# Patient Record
Sex: Male | Born: 1945 | Race: White | Hispanic: No | Marital: Married | Smoking: Never smoker
Health system: Southern US, Community
[De-identification: ages and names within clinical notes are randomized; demographics above are authoritative.]

---

## 1998-12-09 ENCOUNTER — Encounter: Admission: RE | Admit: 1998-12-09 | Discharge: 1998-12-09 | Payer: Self-pay | Admitting: Sports Medicine

## 1999-01-26 ENCOUNTER — Encounter: Admission: RE | Admit: 1999-01-26 | Discharge: 1999-01-26 | Payer: Self-pay | Admitting: Family Medicine

## 1999-02-27 ENCOUNTER — Encounter: Admission: RE | Admit: 1999-02-27 | Discharge: 1999-02-27 | Payer: Self-pay | Admitting: Family Medicine

## 1999-03-02 ENCOUNTER — Encounter: Admission: RE | Admit: 1999-03-02 | Discharge: 1999-03-02 | Payer: Self-pay | Admitting: Family Medicine

## 1999-04-19 ENCOUNTER — Encounter: Admission: RE | Admit: 1999-04-19 | Discharge: 1999-04-19 | Payer: Self-pay | Admitting: Family Medicine

## 1999-05-01 ENCOUNTER — Encounter: Admission: RE | Admit: 1999-05-01 | Discharge: 1999-05-01 | Payer: Self-pay | Admitting: Family Medicine

## 1999-06-27 ENCOUNTER — Encounter: Admission: RE | Admit: 1999-06-27 | Discharge: 1999-06-27 | Payer: Self-pay | Admitting: Sports Medicine

## 2000-09-09 ENCOUNTER — Encounter: Admission: RE | Admit: 2000-09-09 | Discharge: 2000-09-09 | Payer: Self-pay | Admitting: Family Medicine

## 2006-10-29 LAB — CONVERTED CEMR LAB: PSA: NORMAL ng/mL

## 2006-11-18 ENCOUNTER — Ambulatory Visit: Payer: Self-pay | Admitting: Family Medicine

## 2006-11-18 ENCOUNTER — Encounter: Payer: Self-pay | Admitting: Family Medicine

## 2006-11-18 DIAGNOSIS — K4090 Unilateral inguinal hernia, without obstruction or gangrene, not specified as recurrent: Secondary | ICD-10-CM | POA: Insufficient documentation

## 2006-11-18 DIAGNOSIS — M6281 Muscle weakness (generalized): Secondary | ICD-10-CM | POA: Insufficient documentation

## 2006-11-20 ENCOUNTER — Encounter: Payer: Self-pay | Admitting: Family Medicine

## 2006-11-20 ENCOUNTER — Ambulatory Visit: Payer: Self-pay | Admitting: Family Medicine

## 2006-11-20 LAB — CONVERTED CEMR LAB
ALT: 38 units/L (ref 0–53)
AST: 26 units/L (ref 0–37)
Albumin: 4.7 g/dL (ref 3.5–5.2)
Alkaline Phosphatase: 70 units/L (ref 39–117)
BUN: 18 mg/dL (ref 6–23)
HDL: 75 mg/dL (ref >39)
LDL Cholesterol: 177 mg/dL — ABNORMAL HIGH (ref 0–99)
Potassium: 5.4 meq/L — ABNORMAL HIGH (ref 3.5–5.3)
Sodium: 139 meq/L (ref 135–145)
Total Protein: 7.4 g/dL (ref 6.0–8.3)

## 2006-11-21 ENCOUNTER — Telehealth: Payer: Self-pay | Admitting: Family Medicine

## 2006-11-21 DIAGNOSIS — E7800 Pure hypercholesterolemia, unspecified: Secondary | ICD-10-CM | POA: Insufficient documentation

## 2006-12-31 ENCOUNTER — Encounter: Payer: Self-pay | Admitting: Family Medicine

## 2007-04-25 ENCOUNTER — Ambulatory Visit: Payer: Self-pay | Admitting: Family Medicine

## 2007-04-25 LAB — CONVERTED CEMR LAB
ALT: 31 units/L (ref 0–53)
CO2: 21 meq/L (ref 19–32)
Direct LDL: 93 mg/dL
Sodium: 139 meq/L (ref 135–145)
Total Bilirubin: 0.5 mg/dL (ref 0.3–1.2)
Total Protein: 7.3 g/dL (ref 6.0–8.3)

## 2009-01-26 ENCOUNTER — Ambulatory Visit: Payer: Self-pay | Admitting: Family Medicine

## 2009-01-28 ENCOUNTER — Telehealth: Payer: Self-pay | Admitting: Family Medicine

## 2009-01-28 LAB — CONVERTED CEMR LAB
Albumin: 4.6 g/dL (ref 3.5–5.2)
Alkaline Phosphatase: 45 units/L (ref 39–117)
HDL: 59 mg/dL (ref >39)
LDL Cholesterol: 73 mg/dL (ref 0–99)
PSA: 0.8 ng/mL (ref 0.10–4.00)
Total Bilirubin: 0.5 mg/dL (ref 0.3–1.2)
Total CHOL/HDL Ratio: 2.4

## 2010-03-23 ENCOUNTER — Other Ambulatory Visit: Payer: Self-pay | Admitting: Family Medicine

## 2010-03-23 NOTE — Telephone Encounter (Signed)
Will route to Dr. Leveda Anna

## 2011-06-04 ENCOUNTER — Other Ambulatory Visit: Payer: Self-pay | Admitting: Family Medicine

## 2012-05-17 ENCOUNTER — Other Ambulatory Visit: Payer: Self-pay | Admitting: Family Medicine

## 2012-05-19 ENCOUNTER — Telehealth: Payer: Self-pay | Admitting: Family Medicine

## 2012-05-19 NOTE — Telephone Encounter (Signed)
Pt will be out of Simvastatin before his appt on the 24th.  Needs to know if he can have enough called in until then  [facility] [place]

## 2012-05-20 NOTE — Telephone Encounter (Signed)
[facility] will give [facility] enough until dr's appt.pt informed. Isaac Crane, Isaac Crane

## 2012-05-20 NOTE — Telephone Encounter (Signed)
Please call in just enough medicine to last until his appointment. 

## 2012-06-06 ENCOUNTER — Ambulatory Visit (INDEPENDENT_AMBULATORY_CARE_PROVIDER_SITE_OTHER): Payer: BLUE CROSS/BLUE SHIELD | Admitting: Family Medicine

## 2012-06-06 ENCOUNTER — Encounter: Payer: Self-pay | Admitting: Family Medicine

## 2012-06-06 VITALS — BP 128/88 | Temp 97.4°F | Wt 208.1 lb

## 2012-06-06 DIAGNOSIS — E7800 Pure hypercholesterolemia, unspecified: Secondary | ICD-10-CM

## 2012-06-06 DIAGNOSIS — Z23 Encounter for immunization: Secondary | ICD-10-CM

## 2012-06-06 DIAGNOSIS — Z125 Encounter for screening for malignant neoplasm of prostate: Secondary | ICD-10-CM

## 2012-06-06 DIAGNOSIS — Z0000 Encounter for general adult medical examination without abnormal findings: Secondary | ICD-10-CM

## 2012-06-06 DIAGNOSIS — G4700 Insomnia, unspecified: Secondary | ICD-10-CM | POA: Insufficient documentation

## 2012-06-06 LAB — COMPLETE METABOLIC PANEL WITH GFR
CO2: 27 mEq/L (ref 19–32)
Creat: 1.09 mg/dL (ref 0.50–1.35)
GFR, Est African American: 81 mL/min
GFR, Est Non African American: 70 mL/min
Glucose, Bld: 94 mg/dL (ref 70–99)
Total Bilirubin: 0.5 mg/dL (ref 0.3–1.2)
Total Protein: 7.6 g/dL (ref 6.0–8.3)

## 2012-06-06 LAB — LIPID PANEL
Cholesterol: 229 mg/dL — ABNORMAL HIGH (ref 0–200)
HDL: 79 mg/dL (ref >39)
Triglycerides: 92 mg/dL (ref <150)

## 2012-06-06 LAB — PSA: PSA: 0.87 ng/mL (ref <=4.00)

## 2012-06-06 MED ORDER — SIMVASTATIN 40 MG PO TABS: ORAL_TABLET | ORAL | Status: DC

## 2012-06-06 MED ORDER — ALPRAZOLAM 0.5 MG PO TABS: 0.5000 mg | ORAL_TABLET | ORAL | Status: DC | PRN

## 2012-06-06 NOTE — Assessment & Plan Note (Addendum)
Needs labs Called with labs.  Will switch from simvastatin to atorvastatin

## 2012-06-06 NOTE — Assessment & Plan Note (Signed)
Discussed controversy.  Desires screen

## 2012-06-06 NOTE — Progress Notes (Signed)
  Subjective:    Patient ID: Isaac Crane, male    DOB: 1945/10/24, 67 y.o.   MRN: [id number]  HPI Annual check.  Feeling good. Lost mother 01/2012.  Just now refocusing on diet/weight loss. 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  Still going well Cholesterol needs check and refill. Still on ASA HPDP up to date except needs pneumovax.    Review of Systems: No CP, SOB, change bowel or bladder, no bleeding or worrisome skin lesion.     Objective:   Physical ExamHEENT nl Neck supple without mass Lungs clear Cardiac RRR without m or g Abd benign        Assessment & Plan:

## 2012-06-06 NOTE — Patient Instructions (Addendum)
Please sign up for My Chart You got a pneumonia vaccine today.  Once and done. Pneumovax Look up the controversy of PSA screening I will call with blood results - and you can see them on My chart. Stay healthy Limit alcohol consumption

## 2012-06-06 NOTE — Addendum Note (Signed)
Addended by: Tanna Savoy on: 06/06/2012 09:40 AM   Modules accepted: Orders

## 2012-06-09 ENCOUNTER — Encounter: Payer: Self-pay | Admitting: Family Medicine

## 2012-06-09 MED ORDER — ATORVASTATIN CALCIUM 40 MG PO TABS: 40.0000 mg | ORAL_TABLET | Freq: Every day | ORAL | Status: DC

## 2012-06-09 NOTE — Addendum Note (Signed)
Addended by: Tivis Ringer on: 06/09/2012 03:30 PM   Modules accepted: Orders, Medications

## 2012-08-23 ENCOUNTER — Ambulatory Visit (INDEPENDENT_AMBULATORY_CARE_PROVIDER_SITE_OTHER): Payer: Medicare Other | Admitting: Family Medicine

## 2012-08-23 VITALS — BP 123/76 | HR 48 | Temp 97.8°F | Resp 16 | Ht 72.0 in | Wt 203.0 lb

## 2012-08-23 DIAGNOSIS — H9209 Otalgia, unspecified ear: Secondary | ICD-10-CM

## 2012-08-23 DIAGNOSIS — H6982 Other specified disorders of Eustachian tube, left ear: Secondary | ICD-10-CM

## 2012-08-23 DIAGNOSIS — H6980 Other specified disorders of Eustachian tube, unspecified ear: Secondary | ICD-10-CM

## 2012-08-23 DIAGNOSIS — H9202 Otalgia, left ear: Secondary | ICD-10-CM

## 2012-08-23 MED ORDER — MOMETASONE FUROATE 50 MCG/ACT NA SUSP: 2.0000 | Freq: Every day | NASAL | Status: DC

## 2012-08-23 MED ORDER — HYDROCODONE-ACETAMINOPHEN 5-325 MG PO TABS: 1.0000 | ORAL_TABLET | Freq: Four times a day (QID) | ORAL | Status: DC | PRN

## 2012-08-23 NOTE — Patient Instructions (Addendum)
Try zyrtec or allegra once per day, afrin as needed for next 2-3 days only, nasonex at bedtime, and lortab up to every 6 hours if needed for pain. Return to the clinic or go to the nearest emergency room if any of your symptoms worsen or new symptoms occur. Barotitis Media Barotitis media is soreness (inflammation) of the area behind the eardrum (middle ear). This occurs when the auditory tube (Eustachian tube) leading from the back of the throat to the eardrum is blocked. When it is blocked air cannot move in and out of the middle ear to equalize pressure changes. These pressure changes come from changes in altitude when:  Flying.  Driving in the mountains.  Diving. Problems are more likely to occur with pressure changes during times when you are congested as from:  Hay fever.  Upper respiratory infection.  A cold. Damage or hearing loss (barotrauma) caused by this may be permanent. HOME CARE INSTRUCTIONS   Use medicines as recommended by your caregiver. Over the counter medicines will help unblock the canal and can help during times of air travel.  Do not put anything into your ears to clean or unplug them. Eardrops will not be helpful.  Do not swim, dive, or fly until your caregiver says it is all right to do so. If these activities are necessary, chewing gum with frequent swallowing may help. It is also helpful to hold your nose and gently blow to pop your ears for equalizing pressure changes. This forces air into the Eustachian tube.  For little ones with problems, give your baby a bottle of water or juice during periods when pressure changes would be anticipated such as during take offs and landings associated with air travel.  Only take over-the-counter or prescription medicines for pain, discomfort, or fever as directed by your caregiver.  A decongestant may be helpful in de-congesting the middle ear and make pressure equalization easier. This can be even more effective if the  drops (spray) are delivered with the head lying over the edge of a bed with the head tilted toward the ear on the affected side.  If your caregiver has given you a follow-up appointment, it is very important to keep that appointment. Not keeping the appointment could result in a chronic or permanent injury, pain, hearing loss and disability. If there is any problem keeping the appointment, you must call back to this facility for assistance. SEEK IMMEDIATE MEDICAL CARE IF:   You develop a severe headache, dizziness, severe ear pain, or bloody or pus-like drainage from your ears.  An oral temperature above 102 F (38.9 C) develops.  Your problems do not improve or become worse. MAKE SURE YOU:   Understand these instructions.  Will watch your condition.  Will get help right away if you are not doing well or get worse. Document Released: 01/27/2000 Document Revised: 04/23/2011 Document Reviewed: 09/04/2007 [facility] Patient Information 2014 [place], [place].

## 2012-08-23 NOTE — Progress Notes (Signed)
Subjective:    Patient ID: Isaac Crane, male    DOB: [birth year], 67 y.o.   MRN: [id number]  HPI Isaac Crane is a 67 y.o. male  L ear pain - past 2 days.  Worse yesterday. Feels congestion behind ear, less hearing, feels like eustachian tube blocked.  Hx of allergies, but minimal congestion.  No fever. Feels well otherwise. Not dizzy at rest, but when sore - a little bit of vertigo.  Tx: none currently - did take antihistamine few weeks ago for similar sx's -    Review of Systems  Constitutional: Negative for fever and chills.  HENT: Positive for hearing loss, ear pain and tinnitus. Negative for nosebleeds, congestion, sore throat, sinus pressure and ear discharge.   Eyes: Negative for pain, discharge and itching.  Respiratory: Negative for cough.   Skin: Negative for rash.       Objective:   Physical Exam  Constitutional: He is oriented to person, place, and time. He appears well-developed and well-nourished.  HENT:  Head: Normocephalic and atraumatic.  Right Ear: Tympanic membrane, external ear and ear canal normal.  Left Ear: External ear and ear canal normal. Tympanic membrane is not injected and not erythematous. A middle ear effusion (clear fluid behind L tm. no erythema. no d/c in canal. ) is present.  Nose: No rhinorrhea. Right sinus exhibits no maxillary sinus tenderness and no frontal sinus tenderness. Left sinus exhibits no maxillary sinus tenderness and no frontal sinus tenderness.  Mouth/Throat: Oropharynx is clear and moist and mucous membranes are normal. No oropharyngeal exudate or posterior oropharyngeal erythema.  Eyes: Conjunctivae are normal. Pupils are equal, round, and reactive to light.  Neck: Neck supple.  Cardiovascular: Normal rate, regular rhythm, normal heart sounds and intact distal pulses.   No murmur heard. Pulmonary/Chest: Effort normal and breath sounds normal. He has no wheezes. He has no rhonchi. He has no rales.  Abdominal: Soft. There is no  tenderness.  Lymphadenopathy:    He has no cervical adenopathy.  Neurological: He is alert and oriented to person, place, and time.  Skin: Skin is warm and dry. No rash noted.  Psychiatric: He has a normal mood and affect. His behavior is normal.          Assessment & Plan:  Isaac Crane is a 67 y.o. male ETD (eustachian tube dysfunction), left - Plan: mometasone (NASONEX) 50 MCG/ACT nasal spray  Otalgia of left ear - Plan: mometasone (NASONEX) 50 MCG/ACT nasal spray, HYDROcodone-acetaminophen (NORCO/VICODIN) 5-325 MG per tablet  Otalgia with likely ETD as cause, probable underlying AR. Does not appear infected, but rtc precautions discussed.  Trial of short term afrin, zyrtec or allegra qd, lortab temporarily for pain, and Nasonex qhs.  rtc precautions.   Meds ordered this encounter  Medications  . mometasone (NASONEX) 50 MCG/ACT nasal spray    Sig: Place 2 sprays into the nose daily.    Dispense:  17 g    Refill:  2  . HYDROcodone-acetaminophen (NORCO/VICODIN) 5-325 MG per tablet    Sig: Take 1 tablet by mouth every 6 (six) hours as needed for pain.    Dispense:  10 tablet    Refill:  0   Patient Instructions  Try zyrtec or allegra once per day, afrin as needed for next 2-3 days only, nasonex at bedtime, and lortab up to every 6 hours if needed for pain. Return to the clinic or go to the nearest emergency room if any of your symptoms worsen or new  symptoms occur. Barotitis Media Barotitis media is soreness (inflammation) of the area behind the eardrum (middle ear). This occurs when the auditory tube (Eustachian tube) leading from the back of the throat to the eardrum is blocked. When it is blocked air cannot move in and out of the middle ear to equalize pressure changes. These pressure changes come from changes in altitude when:  Flying.  Driving in the mountains.  Diving. Problems are more likely to occur with pressure changes during times when you are congested as  from:  Hay fever.  Upper respiratory infection.  A cold. Damage or hearing loss (barotrauma) caused by this may be permanent. HOME CARE INSTRUCTIONS   Use medicines as recommended by your caregiver. Over the counter medicines will help unblock the canal and can help during times of air travel.  Do not put anything into your ears to clean or unplug them. Eardrops will not be helpful.  Do not swim, dive, or fly until your caregiver says it is all right to do so. If these activities are necessary, chewing gum with frequent swallowing may help. It is also helpful to hold your nose and gently blow to pop your ears for equalizing pressure changes. This forces air into the Eustachian tube.  For little ones with problems, give your baby a bottle of water or juice during periods when pressure changes would be anticipated such as during take offs and landings associated with air travel.  Only take over-the-counter or prescription medicines for pain, discomfort, or fever as directed by your caregiver.  A decongestant may be helpful in de-congesting the middle ear and make pressure equalization easier. This can be even more effective if the drops (spray) are delivered with the head lying over the edge of a bed with the head tilted toward the ear on the affected side.  If your caregiver has given you a follow-up appointment, it is very important to keep that appointment. Not keeping the appointment could result in a chronic or permanent injury, pain, hearing loss and disability. If there is any problem keeping the appointment, you must call back to this facility for assistance. SEEK IMMEDIATE MEDICAL CARE IF:   You develop a severe headache, dizziness, severe ear pain, or bloody or pus-like drainage from your ears.  An oral temperature above 102 F (38.9 C) develops.  Your problems do not improve or become worse. MAKE SURE YOU:   Understand these instructions.  Will watch your  condition.  Will get help right away if you are not doing well or get worse. Document Released: 01/27/2000 Document Revised: 04/23/2011 Document Reviewed: 09/04/2007 [facility] Patient Information 2014 [place], [place].

## 2013-06-10 ENCOUNTER — Other Ambulatory Visit: Payer: Self-pay | Admitting: Family Medicine

## 2014-04-09 ENCOUNTER — Ambulatory Visit
Admission: RE | Admit: 2014-04-09 | Discharge: 2014-04-09 | Disposition: A | Discharge status: Home / Self Care | Payer: Medicare Other | Source: Ambulatory Visit | Attending: Family Medicine | Admitting: Family Medicine

## 2014-04-09 ENCOUNTER — Encounter: Payer: Self-pay | Admitting: Family Medicine

## 2014-04-09 ENCOUNTER — Ambulatory Visit (INDEPENDENT_AMBULATORY_CARE_PROVIDER_SITE_OTHER): Payer: Medicare Other | Admitting: Family Medicine

## 2014-04-09 VITALS — BP 138/76 | HR 98 | Temp 97.4°F | Ht 72.0 in | Wt 212.3 lb

## 2014-04-09 DIAGNOSIS — Z23 Encounter for immunization: Secondary | ICD-10-CM | POA: Diagnosis not present

## 2014-04-09 DIAGNOSIS — M25532 Pain in left wrist: Secondary | ICD-10-CM | POA: Diagnosis not present

## 2014-04-09 LAB — CBC
HCT: 40.1 % (ref 39.0–52.0)
HEMOGLOBIN: 13.8 g/dL (ref 13.0–17.0)
MCH: 31.5 pg (ref 26.0–34.0)
MCHC: 34.4 g/dL (ref 30.0–36.0)
MCV: 91.6 fL (ref 78.0–100.0)
MPV: 10.4 fL (ref 8.6–12.4)
PLATELETS: 235 10*3/uL (ref 150–400)
RBC: 4.38 MIL/uL (ref 4.22–5.81)
RDW: 13.8 % (ref 11.5–15.5)
WBC: 5.2 10*3/uL (ref 4.0–10.5)

## 2014-04-09 LAB — URIC ACID: Uric Acid, Serum: 4.8 mg/dL (ref 4.0–7.8)

## 2014-04-09 LAB — POCT SEDIMENTATION RATE: POCT SED RATE: 22 mm/hr (ref 0–22)

## 2014-04-09 MED ORDER — ALPRAZOLAM 0.5 MG PO TABS: 0.5000 mg | ORAL_TABLET | Freq: Every evening | ORAL | Status: DC | PRN

## 2014-04-09 MED ORDER — DOXYCYCLINE HYCLATE 100 MG PO TABS: 100.0000 mg | ORAL_TABLET | Freq: Two times a day (BID) | ORAL | Status: DC

## 2014-04-09 NOTE — Progress Notes (Signed)
   Subjective:    Patient ID: Isaac Crane, male    DOB: 06-17-1945, 69 y.o.   MRN: [id number]  HPI Left wrist pain.  Non dominant.  Began 3 days ago.  No trauma.  He is active, but cannot think of any repetitive motion problem. No fever.  No previous joint inflammation unexplained.      Review of Systems     Objective:   Physical Exam Redness and swelling around dorsum of left wrist.  Not so red on skin to think cellulitis as top dx. Most tender over medial (ulnar) side at wrist.         Assessment & Plan:

## 2014-04-09 NOTE — Assessment & Plan Note (Signed)
Will check X rays and look for inflammatory lab markers.  Start on ibuprofen.  Gout is possible.  While I doubt this is cellulits, he has a break in the skin and just enough erythema for me to want to cover my bases with doxy.

## 2014-04-09 NOTE — Patient Instructions (Signed)
Please take OTC ibuprofen, 3 tablets 4 times per day until I call Monday.  Stop if you have stomach irritation. I will call with results on Monday.  Enjoy the [place] travel.

## 2014-06-08 ENCOUNTER — Other Ambulatory Visit: Payer: Self-pay | Admitting: Family Medicine

## 2014-08-31 ENCOUNTER — Ambulatory Visit (INDEPENDENT_AMBULATORY_CARE_PROVIDER_SITE_OTHER): Payer: Medicare Other | Admitting: Family Medicine

## 2014-08-31 ENCOUNTER — Encounter: Payer: Self-pay | Admitting: Family Medicine

## 2014-08-31 VITALS — BP 148/84 | HR 57 | Temp 97.7°F | Ht 72.0 in | Wt 209.8 lb

## 2014-08-31 DIAGNOSIS — R1031 Right lower quadrant pain: Secondary | ICD-10-CM

## 2014-08-31 DIAGNOSIS — R1030 Lower abdominal pain, unspecified: Secondary | ICD-10-CM | POA: Diagnosis not present

## 2014-08-31 NOTE — Patient Instructions (Signed)
Thank you for coming in,   You may have an ab strain which is in the muscle or meralgia paresthetica which is a pinched nerve.    You can take tylenol for pain.   You can try capsaicin cream to rub on affected area.   Try to not wear your belt or pants on the area of pain.   If you have a change in your bowel movements or the pain becomes more severe or it radiates into your groin/scrotom then please return to clinic or seek more immediate care.   Please bring all of your medications with you to each visit.    Please feel free to call with any questions or concerns at any time, at [phone]. --Dr. Raeford Razor

## 2014-08-31 NOTE — Assessment & Plan Note (Addendum)
Could be related to a muscle strain vs hernia vs meralgia paresthetica vs OA of the hip.  No deformity observed on exam. Normal hip ROM and strength. He does fly and drive a lot (sitting position) which could be compressing a nerve. No abdominal pain, dysuria, fever.  - wear loose fitting clothing, avoid wearing belt on area, try not to sit for prolonged period of time.  - capsaicin cream PRN   - tylenol PRN for pain  - give home modalities for ab strain. Instructed to stop if becoming worse  - given instructions for return or immediate care  - f/u with PCP in two weeks if no improvement. May need imaging of hip or if concern for hernia

## 2014-08-31 NOTE — Progress Notes (Signed)
   Subjective:    Patient ID: Isaac Crane, male    DOB: 05-29-1945, 69 y.o.   MRN: [id number]  Seen for Same day visit for   CC: muscle pain   Pain is occuring in right inguinal region.  Flew to [place] and returned about two days ago.  Flies on a regular basis.  Started 1.5 weeks ago. Always doing physical activity. Doesn't recall any inciting event.  Thinks he has had this pain before. No abdominal surgery. Worse with bending forward.  Feels fine if he is sitting still. No changes in diarrhea, constipation or dysuria.  Having some numbness along the anterior of his right thigh. - took a walk this AM and the walk didn't make it worse.  - took some ASA and seemed to help.  - localized.   PMH - Smoking status noted.    Review of Systems   See HPI for ROS. Objective:  BP 148/84 mmHg  Pulse 57  Temp(Src) 97.7 F (36.5 C) (Oral)  Ht 6' (1.829 m)  Wt 209 lb 12.8 oz (95.165 kg)  BMI 28.45 kg/m2  General: NAD Abdomen: soft, nontender, nondistended, no hepatic or splenomegaly. Bowel sounds present. Some pain upon palpation of right inguinal region GU: cremasteric reflux intact,  MSK: no ecchymosis or erythema, no obvious deformity in right groin, no TTP of greater trochanter or IT band, normal hip flexion and extension, neurovascularly intact distally, 5/5 strength in LE b/l  Extremities: WWP. Skin: warm and dry, no rashes noted     Assessment & Plan:  See Problem List Documentation

## 2015-06-08 ENCOUNTER — Other Ambulatory Visit: Payer: Self-pay | Admitting: Family Medicine

## 2015-10-08 ENCOUNTER — Ambulatory Visit (INDEPENDENT_AMBULATORY_CARE_PROVIDER_SITE_OTHER): Payer: Medicare Other | Admitting: Physician Assistant

## 2015-10-08 ENCOUNTER — Encounter: Payer: Self-pay | Admitting: Physician Assistant

## 2015-10-08 DIAGNOSIS — H6982 Other specified disorders of Eustachian tube, left ear: Secondary | ICD-10-CM | POA: Diagnosis not present

## 2015-10-08 DIAGNOSIS — H9202 Otalgia, left ear: Secondary | ICD-10-CM

## 2015-10-08 MED ORDER — MOMETASONE FUROATE 50 MCG/ACT NA SUSP: 2.0000 | Freq: Every day | NASAL | 2 refills | Status: DC

## 2015-10-08 NOTE — Progress Notes (Signed)
Isaac Crane  MRN: [id number] DOB: [birth year]  Subjective:  Pt presents to clinic with right ear decreased hearing (muffled) that started a few weeks ago.  About 3 weeks ago he had cold symptoms and his right ear started to hurt and he felt like something opened - blood and clear fluid came out of ear.  That has since stopped but he is still having popping and decreased hearing.   He has had trouble with his ears most of his life. He used some antihistamines and nasal decongestants and used some old Nasonex this am hoping that might help.  Last week he did go to [place] alt[place] and he noticed that his right ear adjusted to the change differently than his left ear.  His cold symptoms has resolved.  Review of Systems  HENT: Positive for ear discharge (3 weeks ago) and hearing loss (muffled). Negative for congestion, ear pain, sore throat and tinnitus.    Patient Active Problem List   Diagnosis Date Noted  . Right groin pain 08/31/2014  . Acute pain of left wrist 04/09/2014  . Prostate cancer screening 06/06/2012  . Insomnia 06/06/2012  . Routine general medical examination at a health care facility 06/06/2012  . HYPERCHOLESTEROLEMIA, PURE 11/21/2006  . INGUINAL HERNIA 11/18/2006    Current Outpatient Prescriptions on File Prior to Visit  Medication Sig Dispense Refill  . ALPRAZolam (XANAX) 0.5 MG tablet Take 1 tablet (0.5 mg total) by mouth at bedtime as needed. For sleep while traveling.  May repeat a dose after 30 minutes if no effect. 60 tablet 2  . aspirin 325 MG tablet Take 325 mg by mouth daily.      Marland Kitchen atorvastatin (LIPITOR) 40 MG tablet TAKE 1 TABLET (40 MG TOTAL) BY MOUTH DAILY. 90 tablet 3   No current facility-administered medications on file prior to visit.     No Known Allergies  Pt patients past, family and social history were reviewed and updated.  Objective:  BP 122/72 (BP Location: Right Arm, Patient Position: Sitting, Cuff Size: Normal)   Pulse (!) 54   Temp 97.8  F (36.6 C) (Oral)   Resp 17   Ht 5' 11.5" (1.816 m)   Wt 211 lb (95.7 kg)   SpO2 99%   BMI 29.02 kg/m   Physical Exam  Constitutional: He is oriented to person, place, and time and well-developed, well-nourished, and in no distress.  HENT:  Head: Normocephalic and atraumatic.  Right Ear: Hearing and ear canal normal. There is drainage (dried blood in canal). Tympanic membrane is retracted. Tympanic membrane is not erythematous.  Left Ear: Hearing, external ear and ear canal normal. A middle ear effusion (serous fluid) is present.  Ears:  Nose: Nose normal.  Mouth/Throat: Uvula is midline, oropharynx is clear and moist and mucous membranes are normal.  Eyes: Conjunctivae are normal.  Neck: Normal range of motion.  Cardiovascular: Normal rate, regular rhythm and normal heart sounds.   Pulmonary/Chest: Effort normal and breath sounds normal. He has no wheezes.  Lymphadenopathy:       Head (right side): No tonsillar adenopathy present.       Head (left side): No tonsillar adenopathy present.    He has no cervical adenopathy.       Right: No supraclavicular adenopathy present.       Left: No supraclavicular adenopathy present.  Neurological: He is alert and oriented to person, place, and time. Gait normal.  Skin: Skin is warm and dry.  Psychiatric: Mood, memory,  affect and judgment normal.    Assessment and Plan :  ETD (eustachian tube dysfunction), left - Plan: mometasone (NASONEX) 50 MCG/ACT nasal spray  Otalgia of left ear - Plan: mometasone (NASONEX) 50 MCG/ACT nasal spray   Appears that patient may have had a TM rupture - today treat the ETD with the above medication and as the TM heals over the next several weeks and the ETD resolved his hearing should improve - he will f/u with me if there is no improvement in his hearing.  Isaac Hummingbird PA-C  [facility] 10/08/2015 9:52 AM

## 2015-10-08 NOTE — Patient Instructions (Addendum)
     IF you received an x-ray today, you will receive an invoice from [facility]. Please contact [facility] at [phone] with questions or concerns regarding your invoice.   IF you received labwork today, you will receive an invoice from [employer]. Please contact [facility] at [phone] with questions or concerns regarding your invoice.   Our billing staff will not be able to assist you with questions regarding bills from these companies.  You will be contacted with the lab results as soon as they are available. The fastest way to get your results is to activate your My Chart account. Instructions are located on the last page of this paperwork. If you have not heard from [place] regarding the results in 2 weeks, please contact this office.     etd

## 2016-06-06 ENCOUNTER — Other Ambulatory Visit: Payer: Self-pay | Admitting: Family Medicine

## 2016-08-27 ENCOUNTER — Other Ambulatory Visit: Payer: Self-pay | Admitting: Family Medicine

## 2016-12-02 ENCOUNTER — Other Ambulatory Visit: Payer: Self-pay | Admitting: Family Medicine

## 2017-02-13 ENCOUNTER — Other Ambulatory Visit: Payer: Self-pay | Admitting: Family Medicine

## 2017-02-22 ENCOUNTER — Encounter: Payer: Self-pay | Admitting: Family Medicine

## 2017-02-22 DIAGNOSIS — Z0000 Encounter for general adult medical examination without abnormal findings: Secondary | ICD-10-CM | POA: Diagnosis not present

## 2017-02-22 LAB — FECAL OCCULT BLOOD, GUAIAC: FECAL OCCULT BLD: NEGATIVE

## 2017-02-27 ENCOUNTER — Telehealth: Payer: Self-pay | Admitting: Family Medicine

## 2017-02-27 ENCOUNTER — Other Ambulatory Visit: Payer: Self-pay | Admitting: Family Medicine

## 2017-02-27 MED ORDER — ATORVASTATIN CALCIUM 40 MG PO TABS: 40.0000 mg | ORAL_TABLET | Freq: Every day | ORAL | 0 refills | Status: DC

## 2017-02-27 NOTE — Telephone Encounter (Signed)
Pt wants to know [employer] didn't approve his atorvastatin.

## 2017-02-27 NOTE — Telephone Encounter (Signed)
Called.  "no problem" to make appointment.  Given an additional 90 days of atorvastatin.

## 2017-03-06 ENCOUNTER — Other Ambulatory Visit: Payer: Self-pay

## 2017-03-06 ENCOUNTER — Telehealth: Payer: Self-pay | Admitting: Family Medicine

## 2017-03-06 ENCOUNTER — Ambulatory Visit (INDEPENDENT_AMBULATORY_CARE_PROVIDER_SITE_OTHER): Payer: Medicare HMO | Admitting: Family Medicine

## 2017-03-06 ENCOUNTER — Encounter: Payer: Self-pay | Admitting: Family Medicine

## 2017-03-06 VITALS — BP 138/74 | HR 55 | Temp 97.4°F | Ht 72.0 in | Wt 211.4 lb

## 2017-03-06 DIAGNOSIS — S40812A Abrasion of left upper arm, initial encounter: Secondary | ICD-10-CM

## 2017-03-06 DIAGNOSIS — Z1159 Encounter for screening for other viral diseases: Secondary | ICD-10-CM | POA: Diagnosis not present

## 2017-03-06 DIAGNOSIS — E7800 Pure hypercholesterolemia, unspecified: Secondary | ICD-10-CM | POA: Diagnosis not present

## 2017-03-06 DIAGNOSIS — Z23 Encounter for immunization: Secondary | ICD-10-CM | POA: Diagnosis not present

## 2017-03-06 DIAGNOSIS — Z0000 Encounter for general adult medical examination without abnormal findings: Secondary | ICD-10-CM

## 2017-03-06 DIAGNOSIS — G4700 Insomnia, unspecified: Secondary | ICD-10-CM

## 2017-03-06 NOTE — Telephone Encounter (Signed)
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  Please advise

## 2017-03-06 NOTE — Patient Instructions (Signed)
Stop the daily aspirin.  You are more likely to have bleeding  I will call with lab test results.  Call me when you get the colon test results - or drop a copy so I can male. The nurse will give you a tetanus shot

## 2017-03-07 ENCOUNTER — Encounter: Payer: Self-pay | Admitting: Family Medicine

## 2017-03-07 LAB — CMP14+EGFR
A/G RATIO: 1.9 (ref 1.2–2.2)
ALT: 33 IU/L (ref 0–44)
AST: 32 IU/L (ref 0–40)
Albumin: 5 g/dL — ABNORMAL HIGH (ref 3.5–4.8)
Alkaline Phosphatase: 69 IU/L (ref 39–117)
BILIRUBIN TOTAL: 0.6 mg/dL (ref 0.0–1.2)
BUN/Creatinine Ratio: 17 (ref 10–24)
BUN: 15 mg/dL (ref 8–27)
CHLORIDE: 103 mmol/L (ref 96–106)
CO2: 21 mmol/L (ref 20–29)
Calcium: 9.5 mg/dL (ref 8.6–10.2)
Creatinine, Ser: 0.89 mg/dL (ref 0.76–1.27)
GFR calc non Af Amer: 86 mL/min/{1.73_m2} (ref >59)
GFR, EST AFRICAN AMERICAN: 99 mL/min/{1.73_m2} (ref >59)
Globulin, Total: 2.6 g/dL (ref 1.5–4.5)
Glucose: 95 mg/dL (ref 65–99)
POTASSIUM: 4.4 mmol/L (ref 3.5–5.2)
Sodium: 140 mmol/L (ref 134–144)
Total Protein: 7.6 g/dL (ref 6.0–8.5)

## 2017-03-07 LAB — CBC
HEMOGLOBIN: 13.9 g/dL (ref 13.0–17.7)
Hematocrit: 41.7 % (ref 37.5–51.0)
MCH: 30.8 pg (ref 26.6–33.0)
MCHC: 33.3 g/dL (ref 31.5–35.7)
MCV: 93 fL (ref 79–97)
Platelets: 201 10*3/uL (ref 150–379)
RBC: 4.51 x10E6/uL (ref 4.14–5.80)
RDW: 13.6 % (ref 12.3–15.4)
WBC: 4.3 10*3/uL (ref 3.4–10.8)

## 2017-03-07 LAB — LIPID PANEL
CHOL/HDL RATIO: 2.4 ratio (ref 0.0–5.0)
Cholesterol, Total: 184 mg/dL (ref 100–199)
HDL: 78 mg/dL (ref >39)
LDL CALC: 97 mg/dL (ref 0–99)
TRIGLYCERIDES: 46 mg/dL (ref 0–149)
VLDL CHOLESTEROL CAL: 9 mg/dL (ref 5–40)

## 2017-03-07 LAB — HEPATITIS C ANTIBODY

## 2017-03-07 MED ORDER — ALPRAZOLAM 0.5 MG PO TABS: 0.5000 mg | ORAL_TABLET | Freq: Every evening | ORAL | 2 refills | Status: DC | PRN

## 2017-03-07 NOTE — Assessment & Plan Note (Signed)
Screen x 1 

## 2017-03-07 NOTE — Assessment & Plan Note (Signed)
Refill xanax

## 2017-03-07 NOTE — Assessment & Plan Note (Signed)
Healthy male with no big risk factors.  Advised to DC daily aspirin for primary prevention because now older than 70

## 2017-03-07 NOTE — Progress Notes (Signed)
   Subjective:    Patient ID: Isaac Crane, male    DOB: May 23, 1945, 72 y.o.   MRN: [id number]  HPI  First visit in over a year.  I made him come in by not refilling meds.  For HPDP visit.  Issues: 1. Hypercholesterolemia on statin.  No complaints.  Needs labs. 2. Abrasion to left forearm, minor but due for tetanus. 3. Travels to [place] leading groups.  Uses xanax for plane flights only.   4. Screening. Due for Hep C and colonoscopy.  He has had two normal studies.  Discussed that at a healthy age 34, it is still recommended.  He will consider.  Also, had insurance nurse visit and they apparently did hemocults.      Review of Systems No chest pain, SOB, headaches, wt or appetite change.  No change in bowel bladder or wt.  No bleeding. No focal weakness or headaches.  Still with ETOH intake but less.  Walks 3 miles per day.     Objective:   Physical Exam HEENT normal Neck supple without nodes. Lungs clear Cardiac RRR without m or g Abd bening. Ext normal small abrasion left dorsal forearm. Neuro normal.        Assessment & Plan:

## 2017-03-07 NOTE — Assessment & Plan Note (Signed)
Tetanus booster

## 2017-03-07 NOTE — Telephone Encounter (Signed)
Done and message left on VM

## 2017-03-07 NOTE — Assessment & Plan Note (Signed)
Good on current Rx.

## 2017-04-02 ENCOUNTER — Encounter: Payer: Self-pay | Admitting: Family Medicine

## 2017-05-26 ENCOUNTER — Other Ambulatory Visit: Payer: Self-pay | Admitting: Family Medicine

## 2017-05-26 DIAGNOSIS — E7800 Pure hypercholesterolemia, unspecified: Secondary | ICD-10-CM

## 2017-07-10 ENCOUNTER — Encounter: Payer: Self-pay | Admitting: Family Medicine

## 2017-11-08 DIAGNOSIS — R69 Illness, unspecified: Secondary | ICD-10-CM | POA: Diagnosis not present

## 2018-01-01 ENCOUNTER — Other Ambulatory Visit: Payer: Self-pay | Admitting: Family Medicine

## 2018-01-01 DIAGNOSIS — Z1211 Encounter for screening for malignant neoplasm of colon: Secondary | ICD-10-CM | POA: Insufficient documentation

## 2018-01-06 DIAGNOSIS — R69 Illness, unspecified: Secondary | ICD-10-CM | POA: Diagnosis not present

## 2018-02-17 ENCOUNTER — Encounter: Payer: Self-pay | Admitting: Family Medicine

## 2018-02-17 ENCOUNTER — Other Ambulatory Visit: Payer: Self-pay

## 2018-02-17 ENCOUNTER — Ambulatory Visit
Admission: RE | Admit: 2018-02-17 | Discharge: 2018-02-17 | Disposition: A | Discharge status: Home / Self Care | Payer: Medicare HMO | Source: Ambulatory Visit | Attending: Family Medicine | Admitting: Family Medicine

## 2018-02-17 ENCOUNTER — Ambulatory Visit (INDEPENDENT_AMBULATORY_CARE_PROVIDER_SITE_OTHER): Payer: Medicare HMO | Admitting: Family Medicine

## 2018-02-17 VITALS — BP 138/72 | HR 71 | Temp 97.6°F | Ht 72.0 in | Wt 212.2 lb

## 2018-02-17 DIAGNOSIS — M2041 Other hammer toe(s) (acquired), right foot: Secondary | ICD-10-CM

## 2018-02-17 DIAGNOSIS — M19071 Primary osteoarthritis, right ankle and foot: Secondary | ICD-10-CM | POA: Diagnosis not present

## 2018-02-17 DIAGNOSIS — Z1211 Encounter for screening for malignant neoplasm of colon: Secondary | ICD-10-CM

## 2018-02-17 DIAGNOSIS — M19072 Primary osteoarthritis, left ankle and foot: Secondary | ICD-10-CM

## 2018-02-17 DIAGNOSIS — Z0000 Encounter for general adult medical examination without abnormal findings: Secondary | ICD-10-CM | POA: Diagnosis not present

## 2018-02-17 DIAGNOSIS — E7800 Pure hypercholesterolemia, unspecified: Secondary | ICD-10-CM

## 2018-02-17 LAB — COLOGUARD: Cologuard: NEGATIVE

## 2018-02-17 MED ORDER — ZOSTER VAC RECOMB ADJUVANTED 50 MCG/0.5ML IM SUSR: 0.5000 mL | Freq: Once | INTRAMUSCULAR | 1 refills | Status: AC

## 2018-02-17 NOTE — Patient Instructions (Addendum)
I will start the process for Cologuard testing, which is good for 3 years. I sent in a new prescription for the Shingles vaccine, Shingrix.  Call me after you get it.  Remember, it is two doses and has side effects.   I will call with blood and x ray results.  Think about whether you want to see a podiatrist (for the hammer toe) or sports medicine for orthotics for your ankle.

## 2018-02-18 LAB — CMP14+EGFR
ALT: 34 IU/L (ref 0–44)
AST: 29 IU/L (ref 0–40)
Albumin/Globulin Ratio: 2 (ref 1.2–2.2)
Albumin: 4.5 g/dL (ref 3.5–4.8)
Alkaline Phosphatase: 88 IU/L (ref 39–117)
BUN/Creatinine Ratio: 11 (ref 10–24)
BUN: 10 mg/dL (ref 8–27)
Bilirubin Total: 0.5 mg/dL (ref 0.0–1.2)
CO2: 24 mmol/L (ref 20–29)
Calcium: 9.2 mg/dL (ref 8.6–10.2)
Chloride: 99 mmol/L (ref 96–106)
Creatinine, Ser: 0.93 mg/dL (ref 0.76–1.27)
GFR calc Af Amer: 94 mL/min/{1.73_m2} (ref >59)
GFR calc non Af Amer: 82 mL/min/{1.73_m2} (ref >59)
Globulin, Total: 2.2 g/dL (ref 1.5–4.5)
Glucose: 85 mg/dL (ref 65–99)
Potassium: 4.6 mmol/L (ref 3.5–5.2)
Sodium: 137 mmol/L (ref 134–144)
Total Protein: 6.7 g/dL (ref 6.0–8.5)

## 2018-02-18 LAB — LIPID PANEL
Chol/HDL Ratio: 2.4 ratio (ref 0.0–5.0)
Cholesterol, Total: 175 mg/dL (ref 100–199)
HDL: 74 mg/dL (ref >39)
LDL CALC: 91 mg/dL (ref 0–99)
Triglycerides: 48 mg/dL (ref 0–149)
VLDL Cholesterol Cal: 10 mg/dL (ref 5–40)

## 2018-02-18 NOTE — Assessment & Plan Note (Signed)
Cologuard

## 2018-02-18 NOTE — Assessment & Plan Note (Signed)
Alcohol intake is less. No other at risk behaviors. Recommend cologuard and shingrix.

## 2018-02-18 NOTE — Assessment & Plan Note (Signed)
FLP shows good response to atorvastatin.  Continue

## 2018-02-18 NOTE — Assessment & Plan Note (Signed)
Xray shows no osteo.  Patient does not want podiatry at this point.  Discuss skin protection to avoid breakdown.

## 2018-02-18 NOTE — Assessment & Plan Note (Signed)
Xray show degenerative changes.  Will refer to Isaac Crane for possible orthotics to correct pronation.

## 2018-02-18 NOTE — Progress Notes (Signed)
 Established Patient Office Visit  Subjective:  Patient ID: Isaac Crane, male    DOB: 02/24/1945  Age: 72 y.o. MRN: [id number]  CC:  Chief Complaint  Patient presents with  . Annual Exam    HPI Isaac Crane presents for annual exam plus a few complaints.  First the complaints. 1. Hammer toe right foot with some skin breakdown on the dorsum.  Has been bothering him for months.  Never had obvious infection.  Has seen podiatrist in the past and decided that he did not want surgery. 2. Left ankle pain with activity.  Old injury.  Thinks it is arthritis.  Never x-rayed.    Now the annual exam stuff: Tolerating statin well Generally healthy diet. Nicely active but most exercise is aerobic with minimal/no resistence. Due for colon cancer screen.  Discussed options and wants cologuard. Got flu shot in Nov elsewhere. Up to date with pneumonia vaccination. Had old zostavax, not new Shingrix.   No past medical history on file.  No past surgical history on file.  No family history on file.  Social History   Socioeconomic History  . Marital status: Married    Spouse name: Not on file  . Number of children: Not on file  . Years of education: Not on file  . Highest education level: Not on file  Occupational History  . Not on file  Social Needs  . Financial resource strain: Not on file  . Food insecurity:    Worry: Not on file    Inability: Not on file  . Transportation needs:    Medical: Not on file    Non-medical: Not on file  Tobacco Use  . Smoking status: Never Smoker  . Smokeless tobacco: Never Used  Substance and Sexual Activity  . Alcohol use: Yes    Alcohol/week: 42.0 standard drinks    Types: 42 drink(s) per week  . Drug use: No  . Sexual activity: Yes    Comment: monagomous  Lifestyle  . Physical activity:    Days per week: Not on file    Minutes per session: Not on file  . Stress: Not on file  Relationships  . Social connections:    Talks on phone: Not  on file    Gets together: Not on file    Attends religious service: Not on file    Active member of club or organization: Not on file    Attends meetings of clubs or organizations: Not on file    Relationship status: Not on file  . Intimate partner violence:    Fear of current or ex partner: Not on file    Emotionally abused: Not on file    Physically abused: Not on file    Forced sexual activity: Not on file  Other Topics Concern  . Not on file  Social History Narrative  . Not on file    Outpatient Medications Prior to Visit  Medication Sig Dispense Refill  . ALPRAZolam (XANAX) 0.5 MG tablet Take 1 tablet (0.5 mg total) by mouth at bedtime as needed. For sleep while traveling.  May repeat a dose after 30 minutes if no effect. 30 tablet 2  . atorvastatin (LIPITOR) 40 MG tablet TAKE 1 TABLET BY MOUTH EVERY DAY 90 tablet 3  . mometasone (NASONEX) 50 MCG/ACT nasal spray Place 2 sprays into the nose daily. 17 g 2   No facility-administered medications prior to visit.     Allergies  Allergen Reactions  . Grass Extracts [Gramineae   Pollens]     ROS Review of Systems No CP, DOE, abd pain, bleeding, focal weakness Does have decrease in stream but not to the point of taking meds Denies change in bowel, appetite or wt.   Objective:    Physical Exam  BP 138/72   Pulse 71   Temp 97.6 F (36.4 C) (Oral)   Ht 6' (1.829 m)   Wt 212 lb 3.2 oz (96.3 kg)   SpO2 99%   BMI 28.78 kg/m  Wt Readings from Last 3 Encounters:  02/17/18 212 lb 3.2 oz (96.3 kg)  03/06/17 211 lb 6.4 oz (95.9 kg)  10/08/15 211 lb (95.7 kg)  GEN WNWD M in NAD HEENT  WNL Neck supple Lungs clear CArdiac RRR without m or g Abd benign Ext Left ankle, marked pronation and loss of longitudinal arch. Rt second toe hammer toe with mild skin irritation but no ulcer on dorsum. Neuro WNL   Health Maintenance Due  Topic Date Due  . COLONOSCOPY  12/30/2016    There are no preventive care reminders to display  for this patient.  No results found for: TSH Lab Results  Component Value Date   WBC 4.3 03/06/2017   HGB 13.9 03/06/2017   HCT 41.7 03/06/2017   MCV 93 03/06/2017   PLT 201 03/06/2017   Lab Results  Component Value Date   NA 137 02/17/2018   K 4.6 02/17/2018   CO2 24 02/17/2018   GLUCOSE 85 02/17/2018   BUN 10 02/17/2018   CREATININE 0.93 02/17/2018   BILITOT 0.5 02/17/2018   ALKPHOS 88 02/17/2018   AST 29 02/17/2018   ALT 34 02/17/2018   PROT 6.7 02/17/2018   ALBUMIN 4.5 02/17/2018   CALCIUM 9.2 02/17/2018   Lab Results  Component Value Date   CHOL 175 02/17/2018   Lab Results  Component Value Date   HDL 74 02/17/2018   Lab Results  Component Value Date   LDLCALC 91 02/17/2018   Lab Results  Component Value Date   TRIG 48 02/17/2018   Lab Results  Component Value Date   CHOLHDL 2.4 02/17/2018   No results found for: HGBA1C    Assessment & Plan:   Problem List Items Addressed This Visit    HYPERCHOLESTEROLEMIA, PURE   Relevant Orders   Lipid panel (Completed)   CMP14+EGFR (Completed)   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 Zoster Vaccine Adjuvanted [facility]) injection    Sig: Inject 0.5 mLs into the muscle once for 1 dose.    Dispense:  0.5 mL    Refill:  1    Refill is for second dose 2-6 months after the first.    Follow-up: No follow-ups on file.    Zenia Resides, MD

## 2018-02-21 DIAGNOSIS — Z1212 Encounter for screening for malignant neoplasm of rectum: Secondary | ICD-10-CM | POA: Diagnosis not present

## 2018-02-21 DIAGNOSIS — Z1211 Encounter for screening for malignant neoplasm of colon: Secondary | ICD-10-CM | POA: Diagnosis not present

## 2018-02-24 NOTE — Addendum Note (Signed)
Addended by: Maryland Pink on: 02/24/2018 10:23 AM   Modules accepted: Orders

## 2018-02-25 ENCOUNTER — Ambulatory Visit: Payer: Medicare HMO | Admitting: Family Medicine

## 2018-02-25 ENCOUNTER — Ambulatory Visit (INDEPENDENT_AMBULATORY_CARE_PROVIDER_SITE_OTHER): Payer: Medicare HMO | Admitting: Family Medicine

## 2018-02-25 VITALS — BP 136/80 | Ht 73.0 in | Wt 206.0 lb

## 2018-02-25 DIAGNOSIS — G8929 Other chronic pain: Secondary | ICD-10-CM | POA: Diagnosis not present

## 2018-02-25 DIAGNOSIS — M25572 Pain in left ankle and joints of left foot: Secondary | ICD-10-CM

## 2018-02-25 NOTE — Patient Instructions (Signed)
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 You also have severe pes planus with medial ankle subluxation of this left ankle. Wear sports insoles when up and walking around for support. Icing 15 minutes at a time only if needed. Strengthening for posterior tibialis tendon with theraband 3 sets of 10 once a day. These are the different medications you can take for this if needed for pain: Tylenol 500mg  1-2 tabs three times a day for pain. Capsaicin, aspercreme, or biofreeze topically up to four times a day may also help with pain. Some supplements that may help for arthritis: Boswellia extract, curcumin, pycnogenol Follow up with me in 1 month.

## 2018-02-26 ENCOUNTER — Encounter: Payer: Self-pay | Admitting: Family Medicine

## 2018-02-26 NOTE — Progress Notes (Signed)
PCP and consultation requested by: Zenia Resides, MD  Subjective:   HPI: Patient is a 73 y.o. male here for left ankle/foot pain.  Patient reports he's had several month history of primarily lateral ankle and foot pain. Pain is at 0/10 level currently, becomes a soreness that limits his walking if he tries to walk any distance. Ok when riding stationary bike. Associated swelling about the ankle but no bruising. No acute injury or trauma. No taking anything for this currently. No numbness.  History reviewed. No pertinent past medical history.  Current Outpatient Medications on File Prior to Visit  Medication Sig Dispense Refill  . atorvastatin (LIPITOR) 40 MG tablet TAKE 1 TABLET BY MOUTH EVERY DAY 90 tablet 3  . ALPRAZolam (XANAX) 0.5 MG tablet Take 1 tablet (0.5 mg total) by mouth at bedtime as needed. For sleep while traveling.  May repeat a dose after 30 minutes if no effect. (Patient not taking: Reported on 02/25/2018) 30 tablet 2   No current facility-administered medications on file prior to visit.     History reviewed. No pertinent surgical history.  Allergies  Allergen Reactions  . Other     Social History   Socioeconomic History  . Marital status: Married    Spouse name: Not on file  . Number of children: Not on file  . Years of education: Not on file  . Highest education level: Not on file  Occupational History  . Not on file  Social Needs  . Financial resource strain: Not on file  . Food insecurity:    Worry: Not on file    Inability: Not on file  . Transportation needs:    Medical: Not on file    Non-medical: Not on file  Tobacco Use  . Smoking status: Never Smoker  . Smokeless tobacco: Never Used  Substance and Sexual Activity  . Alcohol use: Yes    Alcohol/week: 42.0 standard drinks    Types: 42 drink(s) per week  . Drug use: No  . Sexual activity: Yes    Comment: monagomous  Lifestyle  . Physical activity:    Days per week: Not on file     Minutes per session: Not on file  . Stress: Not on file  Relationships  . Social connections:    Talks on phone: Not on file    Gets together: Not on file    Attends religious service: Not on file    Active member of club or organization: Not on file    Attends meetings of clubs or organizations: Not on file    Relationship status: Not on file  . Intimate partner violence:    Fear of current or ex partner: Not on file    Emotionally abused: Not on file    Physically abused: Not on file    Forced sexual activity: Not on file  Other Topics Concern  . Not on file  Social History Narrative  . Not on file    History reviewed. No pertinent family history.  BP 136/80   Ht 6\' 1"  (1.854 m)   Wt 206 lb (93.4 kg)   BMI 27.18 kg/m   Review of Systems: See HPI above.     Objective:  Physical Exam:  Gen: NAD, comfortable in exam room  Left foot/ankle: Severe pes planus, post tib tendon dysfunction with medial ankle subluxation. No ecchymoses.  Swelling around lateral malleolus, medial ankle into foot. Limited IR.  Otherwise FROM TTP mildly over sinus tarsi - reports  consistent with his usual location of pain.  No other tenderness of foot or ankle. Negative ant drawer and talar tilt.   Negative syndesmotic compression. Thompsons test negative. NV intact distally.  Right foot/ankle: Mod pronation. No other deformity. FROM with 5/5 strength. No tenderness to palpation. NVI distally.   Assessment & Plan:  1. Left foot/ankle pain - Independently reviewed radiographs and noted moderate arthropathy of ankle - has a fragment off lateral malleolus that appears old, unrelated to his current pain.  Significant pronation and noted ankle subluxation medially.  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  Sports insoles with medium scaphoid pads provided today and were comfortable to patient - hopefully he can tolerate walking distances better  with this.  We discussed tylenol, topical medications, some supplements that may help.  Icing if needed.  F/u in 1 month.  Consider custom orthotics, showing him how to order sports insoles/pads in bulk from [facility] depending on how he does.  We also discussed ASO as a possibility for additional support.

## 2018-03-03 NOTE — Addendum Note (Signed)
Addended by: Maryland Pink on: 03/03/2018 08:40 AM   Modules accepted: Orders

## 2018-03-13 ENCOUNTER — Ambulatory Visit (INDEPENDENT_AMBULATORY_CARE_PROVIDER_SITE_OTHER): Payer: Medicare HMO | Admitting: Family Medicine

## 2018-03-13 VITALS — BP 150/82 | Ht 73.0 in | Wt 206.0 lb

## 2018-03-13 DIAGNOSIS — M25572 Pain in left ankle and joints of left foot: Secondary | ICD-10-CM

## 2018-03-13 DIAGNOSIS — G8929 Other chronic pain: Secondary | ICD-10-CM

## 2018-03-13 NOTE — Patient Instructions (Signed)
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 You also have severe pes planus with medial ankle subluxation of this left ankle. Wear sports insoles when up and walking around for support - see the booklet as I've circles the sports insoles AND the scaphoid pads you would have to order in bulk Icing 15 minutes at a time only if needed. Strengthening for posterior tibialis tendon with theraband 3 sets of 10 once a day. These are the different medications you can take for this if needed for pain: Tylenol 500mg  1-2 tabs three times a day for pain. Capsaicin, aspercreme, or biofreeze topically up to four times a day may also help with pain. Some supplements that may help for arthritis: Boswellia extract, curcumin, pycnogenol Follow up with me as needed but call me if you need anything.

## 2018-03-14 ENCOUNTER — Encounter: Payer: Self-pay | Admitting: Family Medicine

## 2018-03-14 NOTE — Progress Notes (Signed)
PCP and consultation requested by: Zenia Resides, MD  Subjective:   HPI: Patient is a 73 y.o. male here for left ankle/foot pain.  1/14: Patient reports he's had several month history of primarily lateral ankle and foot pain. Pain is at 0/10 level currently, becomes a soreness that limits his walking if he tries to walk any distance. Ok when riding stationary bike. Associated swelling about the ankle but no bruising. No acute injury or trauma. No taking anything for this currently. No numbness.  1/30: Patient reports feeling much better with arch supports last visit and would like another pair for his shoes, also how to order these from [place]. No pain currently at rest. His wife recently had partial liver resectomy and he has been caring for her. Not done home exercises much yet. No skin changes.  History reviewed. No pertinent past medical history.  Current Outpatient Medications on File Prior to Visit  Medication Sig Dispense Refill  . atorvastatin (LIPITOR) 40 MG tablet TAKE 1 TABLET BY MOUTH EVERY DAY 90 tablet 3  . ALPRAZolam (XANAX) 0.5 MG tablet Take 1 tablet (0.5 mg total) by mouth at bedtime as needed. For sleep while traveling.  May repeat a dose after 30 minutes if no effect. (Patient not taking: Reported on 02/25/2018) 30 tablet 2   No current facility-administered medications on file prior to visit.     History reviewed. No pertinent surgical history.  Allergies  Allergen Reactions  . Other     Social History   Socioeconomic History  . Marital status: Married    Spouse name: Not on file  . Number of children: Not on file  . Years of education: Not on file  . Highest education level: Not on file  Occupational History  . Not on file  Social Needs  . Financial resource strain: Not on file  . Food insecurity:    Worry: Not on file    Inability: Not on file  . Transportation needs:    Medical: Not on file    Non-medical: Not on file  Tobacco Use   . Smoking status: Never Smoker  . Smokeless tobacco: Never Used  Substance and Sexual Activity  . Alcohol use: Yes    Alcohol/week: 42.0 standard drinks    Types: 42 drink(s) per week  . Drug use: No  . Sexual activity: Yes    Comment: monagomous  Lifestyle  . Physical activity:    Days per week: Not on file    Minutes per session: Not on file  . Stress: Not on file  Relationships  . Social connections:    Talks on phone: Not on file    Gets together: Not on file    Attends religious service: Not on file    Active member of club or organization: Not on file    Attends meetings of clubs or organizations: Not on file    Relationship status: Not on file  . Intimate partner violence:    Fear of current or ex partner: Not on file    Emotionally abused: Not on file    Physically abused: Not on file    Forced sexual activity: Not on file  Other Topics Concern  . Not on file  Social History Narrative  . Not on file    History reviewed. No pertinent family history.  BP (!) 150/82   Ht 6\' 1"  (1.854 m)   Wt 206 lb (93.4 kg)   BMI 27.18 kg/m   Review  of Systems: See HPI above.     Objective:  Physical Exam:  Gen: NAD, comfortable in exam room  Rest of exam not repeated today.  Left foot/ankle: Severe pes planus, post tib tendon dysfunction with medial ankle subluxation. No ecchymoses.  Swelling around lateral malleolus, medial ankle into foot. Limited IR.  Otherwise FROM TTP mildly over sinus tarsi - reports consistent with his usual location of pain.  No other tenderness of foot or ankle. Negative ant drawer and talar tilt.   Negative syndesmotic compression. Thompsons test negative. NV intact distally.  Right foot/ankle: Mod pronation. No other deformity. FROM with 5/5 strength. No tenderness to palpation. NVI distally.   Assessment & Plan:  1. Left foot/ankle pain - moderate ankle arthropathy.  Left ankle subluxation with significant pronation.  Sinus  tarsi syndrome as well.  Much better with sports insoles with scaphoid pads.  Provided another pair today and gave him a booklet, showed him how to order these in bulk.  Icing, tylenol only if needed.  Consider ASO, custom orthotics in future.  F/u prn.

## 2018-05-30 ENCOUNTER — Other Ambulatory Visit: Payer: Self-pay | Admitting: Family Medicine

## 2018-05-30 DIAGNOSIS — E7800 Pure hypercholesterolemia, unspecified: Secondary | ICD-10-CM

## 2019-03-06 ENCOUNTER — Ambulatory Visit: Payer: Medicare HMO | Attending: Internal Medicine

## 2019-03-06 DIAGNOSIS — Z23 Encounter for immunization: Secondary | ICD-10-CM | POA: Insufficient documentation

## 2019-03-06 NOTE — Progress Notes (Signed)
   Covid-19 Vaccination Clinic  Name:  Isaac Crane    MRN: [id number] DOB: 1945/06/10  03/06/2019  [name] was observed post Covid-19 immunization for 15 minutes without incidence. He was provided with Vaccine Information Sheet and instruction to access the V-Safe system.   [name] was instructed to call 911 with any severe reactions post vaccine: Marland Kitchen Difficulty breathing  . Swelling of your face and throat  . A fast heartbeat  . A bad rash all over your body  . Dizziness and weakness    Immunizations Administered    Name Date Dose VIS Date Route   Pfizer COVID-19 Vaccine 03/06/2019  5:41 PM 0.3 mL 01/23/2019 Intramuscular   Manufacturer: [place]   Lot: [id number]   [place]: [id number]

## 2019-03-27 ENCOUNTER — Ambulatory Visit: Payer: Medicare HMO | Attending: Internal Medicine

## 2019-03-27 DIAGNOSIS — Z23 Encounter for immunization: Secondary | ICD-10-CM | POA: Insufficient documentation

## 2019-03-27 NOTE — Progress Notes (Signed)
   Covid-19 Vaccination Clinic  Name:  Isaac Crane    MRN: [id number] DOB: Jul 26, 1945  03/27/2019  [name] was observed post Covid-19 immunization for 15 minutes without incidence. He was provided with Vaccine Information Sheet and instruction to access the V-Safe system.   [name] was instructed to call 911 with any severe reactions post vaccine: Marland Kitchen Difficulty breathing  . Swelling of your face and throat  . A fast heartbeat  . A bad rash all over your body  . Dizziness and weakness    Immunizations Administered    Name Date Dose VIS Date Route   Pfizer COVID-19 Vaccine 03/27/2019  4:16 PM 0.3 mL 01/23/2019 Intramuscular   Manufacturer: [place]   Lot: [id number]   [place]: [id number]

## 2019-06-20 ENCOUNTER — Other Ambulatory Visit: Payer: Self-pay | Admitting: Family Medicine

## 2019-06-20 DIAGNOSIS — E7800 Pure hypercholesterolemia, unspecified: Secondary | ICD-10-CM

## 2019-10-05 ENCOUNTER — Other Ambulatory Visit: Payer: Self-pay | Admitting: Family Medicine

## 2019-10-05 DIAGNOSIS — E7800 Pure hypercholesterolemia, unspecified: Secondary | ICD-10-CM

## 2019-11-03 DIAGNOSIS — D1801 Hemangioma of skin and subcutaneous tissue: Secondary | ICD-10-CM | POA: Diagnosis not present

## 2019-11-03 DIAGNOSIS — L570 Actinic keratosis: Secondary | ICD-10-CM | POA: Diagnosis not present

## 2019-11-03 DIAGNOSIS — L819 Disorder of pigmentation, unspecified: Secondary | ICD-10-CM | POA: Diagnosis not present

## 2019-11-03 DIAGNOSIS — D229 Melanocytic nevi, unspecified: Secondary | ICD-10-CM | POA: Diagnosis not present

## 2019-11-03 DIAGNOSIS — L821 Other seborrheic keratosis: Secondary | ICD-10-CM | POA: Diagnosis not present

## 2019-11-03 DIAGNOSIS — D485 Neoplasm of uncertain behavior of skin: Secondary | ICD-10-CM | POA: Diagnosis not present

## 2019-11-03 DIAGNOSIS — L814 Other melanin hyperpigmentation: Secondary | ICD-10-CM | POA: Diagnosis not present

## 2019-11-13 DIAGNOSIS — R69 Illness, unspecified: Secondary | ICD-10-CM | POA: Diagnosis not present

## 2019-11-28 ENCOUNTER — Ambulatory Visit: Payer: Medicare HMO | Attending: Internal Medicine

## 2019-11-28 DIAGNOSIS — Z23 Encounter for immunization: Secondary | ICD-10-CM

## 2019-11-28 NOTE — Progress Notes (Signed)
   Covid-19 Vaccination Clinic  Name:  Isaac Crane    MRN: [id number] DOB: 07/19/1945  11/28/2019  [name] was observed post Covid-19 immunization for 15 minutes without incident. He was provided with Vaccine Information Sheet and instruction to access the V-Safe system.   [name] was instructed to call 911 with any severe reactions post vaccine: Marland Kitchen Difficulty breathing  . Swelling of face and throat  . A fast heartbeat  . A bad rash all over body  . Dizziness and weakness

## 2019-12-03 DIAGNOSIS — R69 Illness, unspecified: Secondary | ICD-10-CM | POA: Diagnosis not present

## 2019-12-27 ENCOUNTER — Other Ambulatory Visit: Payer: Self-pay | Admitting: Family Medicine

## 2019-12-27 DIAGNOSIS — E7800 Pure hypercholesterolemia, unspecified: Secondary | ICD-10-CM

## 2020-01-05 DIAGNOSIS — D692 Other nonthrombocytopenic purpura: Secondary | ICD-10-CM | POA: Diagnosis not present

## 2020-01-06 DIAGNOSIS — Z0100 Encounter for examination of eyes and vision without abnormal findings: Secondary | ICD-10-CM | POA: Diagnosis not present

## 2020-01-06 DIAGNOSIS — H259 Unspecified age-related cataract: Secondary | ICD-10-CM | POA: Diagnosis not present

## 2020-01-06 DIAGNOSIS — H52229 Regular astigmatism, unspecified eye: Secondary | ICD-10-CM | POA: Diagnosis not present

## 2020-03-27 ENCOUNTER — Other Ambulatory Visit: Payer: Self-pay | Admitting: Family Medicine

## 2020-03-27 DIAGNOSIS — E7800 Pure hypercholesterolemia, unspecified: Secondary | ICD-10-CM

## 2020-05-04 DIAGNOSIS — L570 Actinic keratosis: Secondary | ICD-10-CM | POA: Diagnosis not present

## 2020-05-04 DIAGNOSIS — D485 Neoplasm of uncertain behavior of skin: Secondary | ICD-10-CM | POA: Diagnosis not present

## 2020-05-04 DIAGNOSIS — L814 Other melanin hyperpigmentation: Secondary | ICD-10-CM | POA: Diagnosis not present

## 2020-05-04 DIAGNOSIS — L821 Other seborrheic keratosis: Secondary | ICD-10-CM | POA: Diagnosis not present

## 2020-05-04 DIAGNOSIS — L111 Transient acantholytic dermatosis [Grover]: Secondary | ICD-10-CM | POA: Diagnosis not present

## 2020-05-04 DIAGNOSIS — L853 Xerosis cutis: Secondary | ICD-10-CM | POA: Diagnosis not present

## 2020-05-04 DIAGNOSIS — D229 Melanocytic nevi, unspecified: Secondary | ICD-10-CM | POA: Diagnosis not present

## 2020-06-22 ENCOUNTER — Other Ambulatory Visit: Payer: Self-pay

## 2020-06-22 DIAGNOSIS — E7800 Pure hypercholesterolemia, unspecified: Secondary | ICD-10-CM

## 2020-06-22 MED ORDER — ATORVASTATIN CALCIUM 40 MG PO TABS: 1.0000 | ORAL_TABLET | Freq: Every day | ORAL | 0 refills | Status: DC

## 2020-06-22 NOTE — Telephone Encounter (Signed)
Patient calls nurse line requesting 2 week refill of atorvastatin. Patient is currently staying at home in [place], [place]. Patient reports that he has ran out of atorvastatin. Patient has appointment with Dr. Andria Frames on 5/23 for physical.   Please advise if two week supply can [facility] [place].   Talbot Grumbling, RN

## 2020-06-23 ENCOUNTER — Other Ambulatory Visit: Payer: Self-pay | Admitting: Family Medicine

## 2020-06-23 DIAGNOSIS — E7800 Pure hypercholesterolemia, unspecified: Secondary | ICD-10-CM

## 2020-07-04 ENCOUNTER — Ambulatory Visit (INDEPENDENT_AMBULATORY_CARE_PROVIDER_SITE_OTHER): Payer: Medicare HMO | Admitting: Family Medicine

## 2020-07-04 ENCOUNTER — Encounter: Payer: Self-pay | Admitting: Family Medicine

## 2020-07-04 ENCOUNTER — Ambulatory Visit (INDEPENDENT_AMBULATORY_CARE_PROVIDER_SITE_OTHER): Payer: Medicare HMO

## 2020-07-04 ENCOUNTER — Other Ambulatory Visit: Payer: Self-pay

## 2020-07-04 VITALS — BP 136/84 | HR 67 | Ht 73.0 in | Wt 184.6 lb

## 2020-07-04 DIAGNOSIS — Z23 Encounter for immunization: Secondary | ICD-10-CM

## 2020-07-04 DIAGNOSIS — R7401 Elevation of levels of liver transaminase levels: Secondary | ICD-10-CM | POA: Diagnosis not present

## 2020-07-04 DIAGNOSIS — Z0000 Encounter for general adult medical examination without abnormal findings: Secondary | ICD-10-CM | POA: Diagnosis not present

## 2020-07-04 DIAGNOSIS — E7800 Pure hypercholesterolemia, unspecified: Secondary | ICD-10-CM | POA: Diagnosis not present

## 2020-07-04 DIAGNOSIS — T753XXD Motion sickness, subsequent encounter: Secondary | ICD-10-CM

## 2020-07-04 DIAGNOSIS — T753XXA Motion sickness, initial encounter: Secondary | ICD-10-CM | POA: Insufficient documentation

## 2020-07-04 DIAGNOSIS — G4700 Insomnia, unspecified: Secondary | ICD-10-CM | POA: Diagnosis not present

## 2020-07-04 MED ORDER — ALPRAZOLAM 0.5 MG PO TABS: 0.5000 mg | ORAL_TABLET | Freq: Every evening | ORAL | 2 refills | Status: AC | PRN

## 2020-07-04 MED ORDER — SCOPOLAMINE 1 MG/3DAYS TD PT72
1.0000 | MEDICATED_PATCH | TRANSDERMAL | 12 refills | Status: DC
Start: 2020-07-04 — End: 2021-07-06

## 2020-07-04 NOTE — Assessment & Plan Note (Signed)
Recheck lipids

## 2020-07-04 NOTE — Progress Notes (Signed)
    SUBJECTIVE:   CHIEF COMPLAINT / HPI:   Annual physical. Acute problems: none. Chronic problems 1. Hammer toe right foot.  Debating about whether he want surgical correction. 2. Multiple dental problems.  Dentistry has recommended full implants. For both, we talked about function, not cosmetics, should drive the intervention strategy. HPDP: He was a regular alcohol user.  Never struck me as problematic and no negative consequences.  Still, he decided to quit altogether.  Feels great and has lost weight.  Now exercising regularly.  Walking miles each day.  Healthy diet.   He has been cautious around Covid.  Still masks.  Due for COVID booster number 2.  Also due for cholesterol screen.  He is 1 and because he is healthy, we discussed continuing colon cancer screen via his choice, cologuard.  He will be due again next year.  PERTINENT  PMH / PSH: Denies CP, DOE, abd pain, focal neuro problems, bleeding, worrisome skin lesions.  Denies change in bowel, bladder, or appetite  OBJECTIVE:   BP 136/84   Pulse 67   Ht 6\' 1"  (1.854 m)   Wt 184 lb 9.6 oz (83.7 kg)   SpO2 98%   BMI 24.36 kg/m   HEENT normal Neck supple Lungs clear Cardiac RRR without m or g abd benign. Feet right foot 2nd toe is a hammer toe with some abrasion at proximal phalengial joint.  Lateral deviation of great toes both feet with bunion formation.  ASSESSMENT/PLAN:   Routine general medical examination at a health care facility Healthy male with no at risk behaviors.  Just the opposite.  He is doing all he can to maintain health.  Motion sickness Has a boat.  Gets motion sickness.  Has used scope patches before and they work great  HYPERCHOLESTEROLEMIA, PURE Recheck lipids.    Insomnia Only when he travels.  Refilled his prn alprazolam  COVID-19 vaccine administered Booster given     Zenia Resides, MD [place]

## 2020-07-04 NOTE — Assessment & Plan Note (Signed)
Booster given

## 2020-07-04 NOTE — Patient Instructions (Addendum)
Maybe another cologuard next year I will call with lab results. Great job on doing what you can to remain young old.   Stay healthy and see me in a year.

## 2020-07-04 NOTE — Assessment & Plan Note (Signed)
Healthy male with no at risk behaviors.  Just the opposite.  He is doing all he can to maintain health.

## 2020-07-04 NOTE — Assessment & Plan Note (Signed)
Only when he travels.  Refilled his prn alprazolam

## 2020-07-04 NOTE — Assessment & Plan Note (Signed)
Has a boat.  Gets motion sickness.  Has used scope patches before and they work great

## 2020-07-05 DIAGNOSIS — R7401 Elevation of levels of liver transaminase levels: Secondary | ICD-10-CM | POA: Insufficient documentation

## 2020-07-05 LAB — TSH: TSH: 1.38 u[IU]/mL (ref 0.450–4.500)

## 2020-07-05 LAB — LIPID PANEL
Chol/HDL Ratio: 2.1 ratio (ref 0.0–5.0)
Cholesterol, Total: 147 mg/dL (ref 100–199)
HDL: 69 mg/dL (ref >39)
LDL Chol Calc (NIH): 67 mg/dL (ref 0–99)
Triglycerides: 51 mg/dL (ref 0–149)
VLDL Cholesterol Cal: 11 mg/dL (ref 5–40)

## 2020-07-05 LAB — CMP14+EGFR
ALT: 49 IU/L — ABNORMAL HIGH (ref 0–44)
AST: 34 IU/L (ref 0–40)
Albumin/Globulin Ratio: 2 (ref 1.2–2.2)
Albumin: 4.7 g/dL (ref 3.7–4.7)
Alkaline Phosphatase: 113 IU/L (ref 44–121)
BUN/Creatinine Ratio: 14 (ref 10–24)
BUN: 13 mg/dL (ref 8–27)
Bilirubin Total: 0.5 mg/dL (ref 0.0–1.2)
CO2: 22 mmol/L (ref 20–29)
Calcium: 9.7 mg/dL (ref 8.6–10.2)
Chloride: 98 mmol/L (ref 96–106)
Creatinine, Ser: 0.92 mg/dL (ref 0.76–1.27)
Globulin, Total: 2.4 g/dL (ref 1.5–4.5)
Glucose: 98 mg/dL (ref 65–99)
Potassium: 4.6 mmol/L (ref 3.5–5.2)
Sodium: 137 mmol/L (ref 134–144)
Total Protein: 7.1 g/dL (ref 6.0–8.5)
eGFR: 87 mL/min/{1.73_m2} (ref >59)

## 2020-07-05 NOTE — Assessment & Plan Note (Signed)
Unexpected MILD transaminitis on lab.  Called and informed patient.  Will repeat in 3 months.

## 2020-07-05 NOTE — Addendum Note (Signed)
Addended by: Zenia Resides on: 07/05/2020 10:31 AM   Modules accepted: Orders

## 2020-07-07 ENCOUNTER — Ambulatory Visit (INDEPENDENT_AMBULATORY_CARE_PROVIDER_SITE_OTHER): Payer: Medicare HMO

## 2020-07-07 ENCOUNTER — Other Ambulatory Visit: Payer: Self-pay

## 2020-07-07 ENCOUNTER — Ambulatory Visit (INDEPENDENT_AMBULATORY_CARE_PROVIDER_SITE_OTHER): Payer: Medicare HMO | Admitting: Podiatry

## 2020-07-07 ENCOUNTER — Encounter: Payer: Self-pay | Admitting: Podiatry

## 2020-07-07 DIAGNOSIS — M2041 Other hammer toe(s) (acquired), right foot: Secondary | ICD-10-CM | POA: Diagnosis not present

## 2020-07-07 DIAGNOSIS — M2042 Other hammer toe(s) (acquired), left foot: Secondary | ICD-10-CM

## 2020-07-08 ENCOUNTER — Other Ambulatory Visit: Payer: Self-pay | Admitting: Podiatry

## 2020-07-08 DIAGNOSIS — M2041 Other hammer toe(s) (acquired), right foot: Secondary | ICD-10-CM

## 2020-07-09 ENCOUNTER — Other Ambulatory Visit: Payer: Self-pay | Admitting: Family Medicine

## 2020-07-09 DIAGNOSIS — E7800 Pure hypercholesterolemia, unspecified: Secondary | ICD-10-CM

## 2020-07-10 NOTE — Progress Notes (Signed)
  Subjective:  Patient ID: Isaac Crane, male    DOB: 1945-04-13,  MRN: [id number] HPI Chief Complaint  Patient presents with  . Toe Pain    2nd toe right - HT deformity x years, rubs shoes, gets red at the knuckle and scabs over sometimes, affecting daily living  . New Patient (Initial Visit)    75 y.o. male presents with the above complaint.   ROS: Denies fever chills nausea vomiting muscle aches pains calf pain back pain chest pain shortness of breath.  No past medical history on file. No past surgical history on file.  Current Outpatient Medications:  .  ALPRAZolam (XANAX) 0.5 MG tablet, Take 1 tablet (0.5 mg total) by mouth at bedtime as needed. For sleep while traveling.  May repeat a dose after 30 minutes if no effect., Disp: 30 tablet, Rfl: 2 .  atorvastatin (LIPITOR) 40 MG tablet, Take 1 tablet (40 mg total) by mouth daily., Disp: 14 tablet, Rfl: 0 .  scopolamine (TRANSDERM-SCOP, 1.5 MG,) 1 MG/3DAYS, Place 1 patch (1.5 mg total) onto the skin every 3 (three) days., Disp: 10 patch, Rfl: 12  Allergies  Allergen Reactions  . Other    Review of Systems Objective:  There were no vitals filed for this visit.  General: Well developed, nourished, in no acute distress, alert and oriented x3   Dermatological: Skin is warm, dry and supple bilateral. Nails x 10 are well maintained; remaining integument appears unremarkable at this time. There are no open sores, no preulcerative lesions, no rash or signs of infection present.  Vascular: Dorsalis Pedis artery and Posterior Tibial artery pedal pulses are 2/4 bilateral with immedate capillary fill time. Pedal hair growth present. No varicosities and no lower extremity edema present bilateral.   Neruologic: Grossly intact via light touch bilateral. Vibratory intact via tuning fork bilateral. Protective threshold with Semmes Wienstein monofilament intact to all pedal sites bilateral. Patellar and Achilles deep tendon reflexes 2+ bilateral.  No Babinski or clonus noted bilateral.   Musculoskeletal: No gross boney pedal deformities bilateral. No pain, crepitus, or limitation noted with foot and ankle range of motion bilateral. Muscular strength 5/5 in all groups tested bilateral.  Severe hallux abductovalgus deformity with increase the first intermetatarsal angle greater than normal value with dislocation of the second toe dorsally.  It appears to have very little in the way of capsular restraint the medial lateral collateral ligaments appear to be torn at the very least the plantar plate is torn.    Gait: Unassisted, Nonantalgic.    Radiographs:  Radiographs taken today demonstrate an osseously mature individual with moderate to severe hallux abductovalgus deformity and hammertoe deformities bilateral.  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  Plan: Discussed in great detail today options consisting of appropriate shoe gear as well as surgery he understands this and is amendable to it.  At this point he states that he will just continue to make sure he has appropriate shoe gear.     Mikalah Skyles T. [place], [place]

## 2020-07-26 DIAGNOSIS — L819 Disorder of pigmentation, unspecified: Secondary | ICD-10-CM | POA: Diagnosis not present

## 2020-07-26 DIAGNOSIS — L570 Actinic keratosis: Secondary | ICD-10-CM | POA: Diagnosis not present

## 2020-07-26 DIAGNOSIS — Z85828 Personal history of other malignant neoplasm of skin: Secondary | ICD-10-CM | POA: Diagnosis not present

## 2020-07-26 DIAGNOSIS — L905 Scar conditions and fibrosis of skin: Secondary | ICD-10-CM | POA: Diagnosis not present

## 2020-08-24 IMAGING — CR DG ANKLE COMPLETE 3+V*L*
3 series · 3 of 3 positions shown · non-contrast
Comparison: None.

CLINICAL DATA: Chronic left lateral ankle pain.  No known injury.

EXAM:
LEFT ANKLE COMPLETE - 3+ VIEW

[t ankle joint ap left]
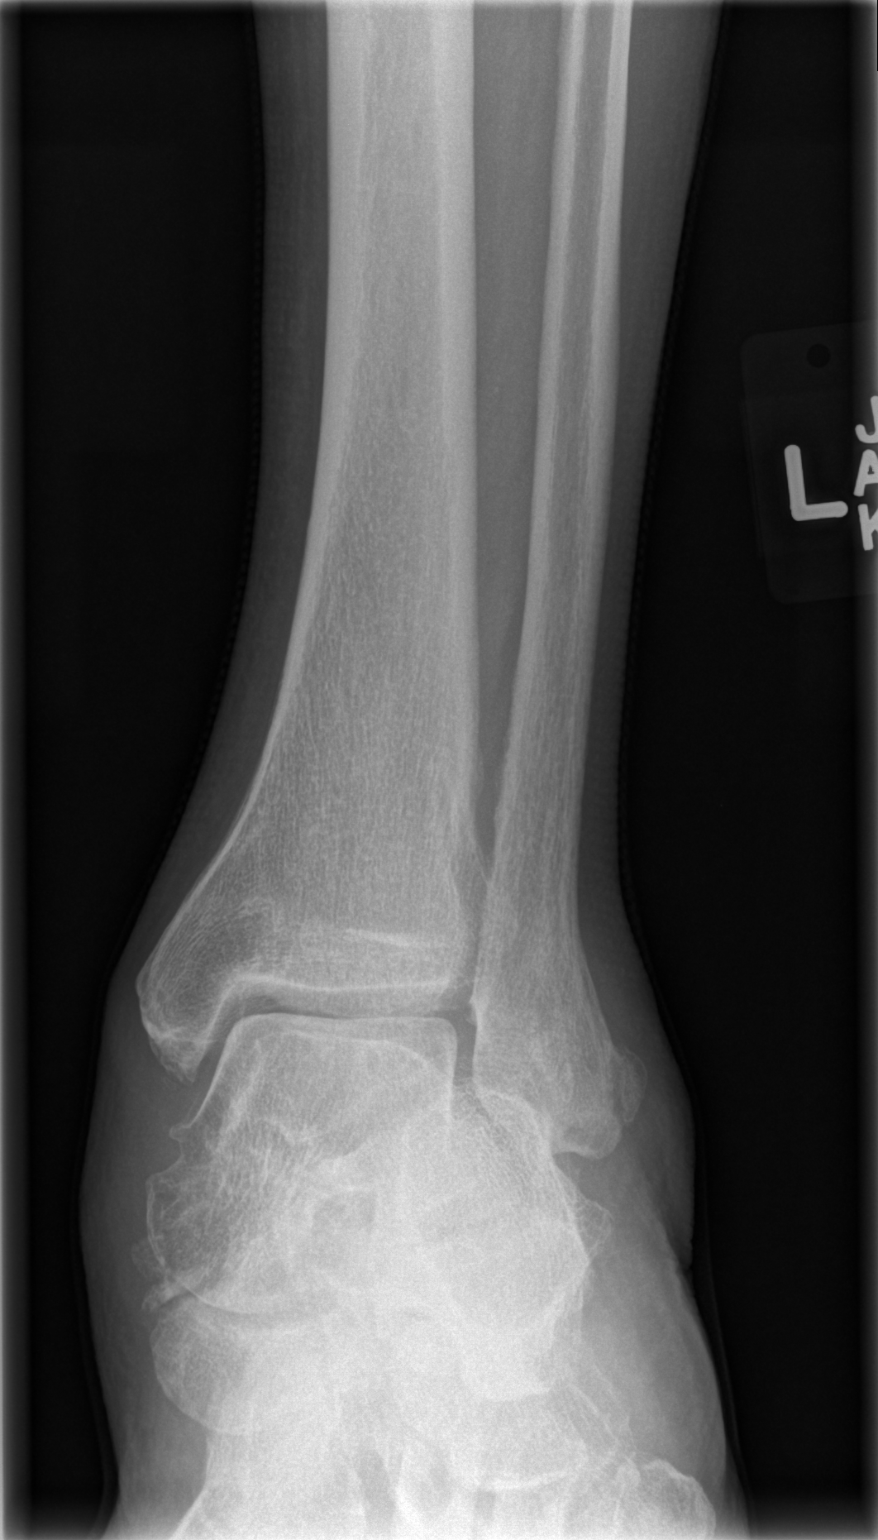

[t ankle joint oblique left]
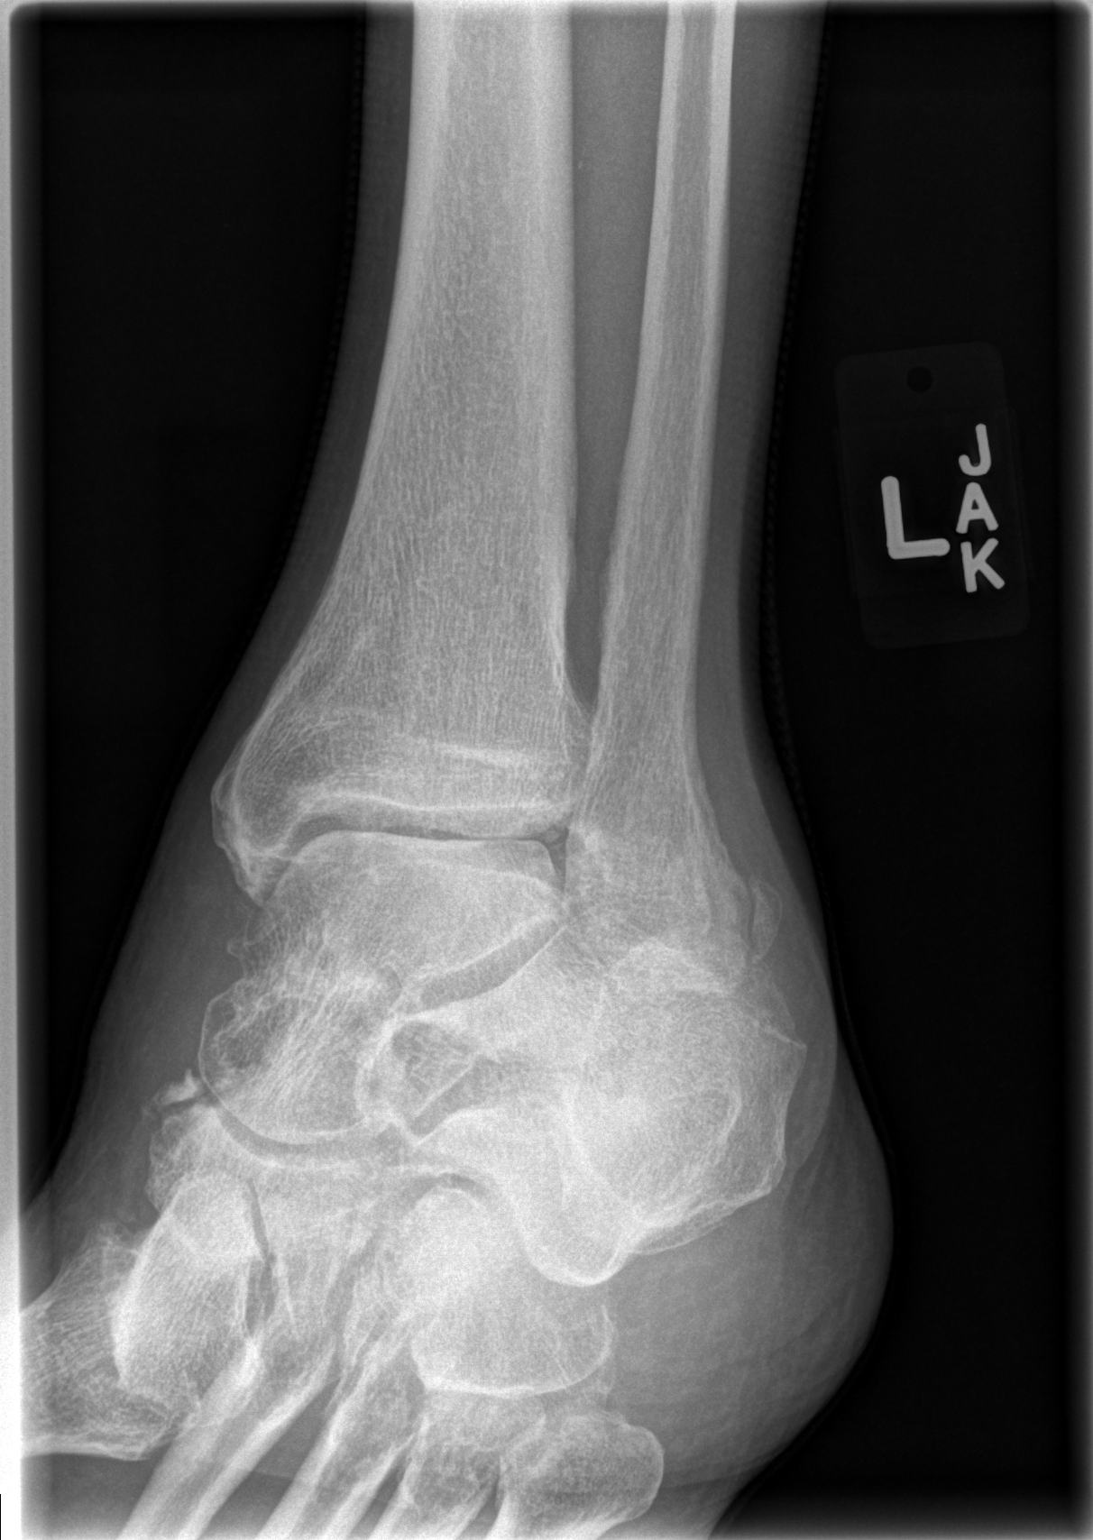

[t ankle joint lat left]
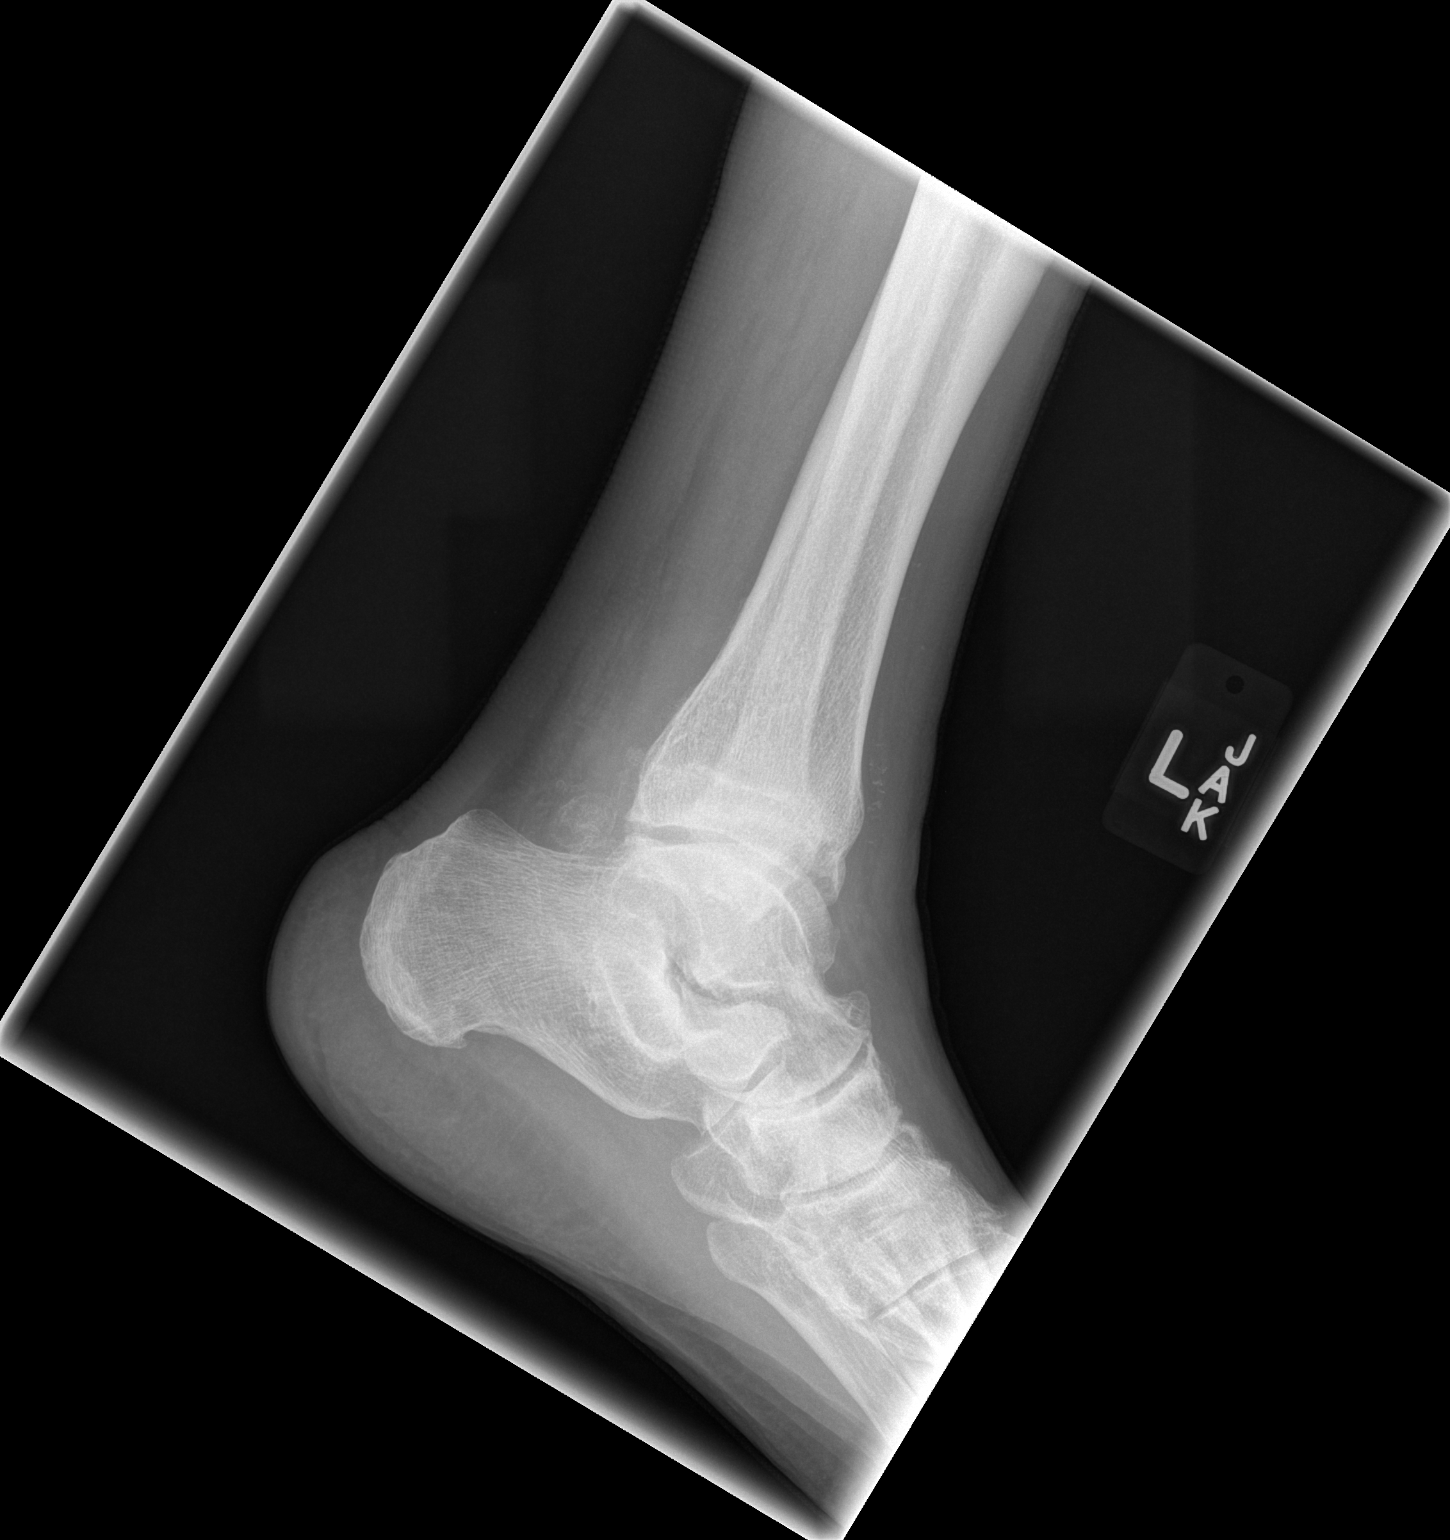

[3 of 3 positions shown; findings below may reference images not displayed]

FINDINGS: Degenerative changes in the left ankle and hindfoot. Small bone
fragment which is well corticated adjacent to the lateral aspect of
the lateral malleolus, likely related to old avulsion injury. No
acute fracture seen. No subluxation or dislocation.
IMPRESSION: Probable old avulsion off the lateral aspect of the lateral
malleolus with well corticated bone fragment.

No acute bony abnormality.

Degenerative changes in the ankle and hindfoot.

## 2020-10-03 ENCOUNTER — Other Ambulatory Visit: Payer: Medicare HMO

## 2020-10-03 ENCOUNTER — Other Ambulatory Visit: Payer: Self-pay

## 2020-10-03 DIAGNOSIS — R7401 Elevation of levels of liver transaminase levels: Secondary | ICD-10-CM

## 2020-10-04 DIAGNOSIS — H5200 Hypermetropia, unspecified eye: Secondary | ICD-10-CM | POA: Diagnosis not present

## 2020-10-04 LAB — CMP14+EGFR
ALT: 51 IU/L — ABNORMAL HIGH (ref 0–44)
AST: 40 IU/L (ref 0–40)
Albumin/Globulin Ratio: 2.2 (ref 1.2–2.2)
Albumin: 4.7 g/dL (ref 3.7–4.7)
Alkaline Phosphatase: 114 IU/L (ref 44–121)
BUN/Creatinine Ratio: 21 (ref 10–24)
BUN: 18 mg/dL (ref 8–27)
Bilirubin Total: 0.3 mg/dL (ref 0.0–1.2)
CO2: 21 mmol/L (ref 20–29)
Calcium: 9.4 mg/dL (ref 8.6–10.2)
Chloride: 101 mmol/L (ref 96–106)
Creatinine, Ser: 0.86 mg/dL (ref 0.76–1.27)
Globulin, Total: 2.1 g/dL (ref 1.5–4.5)
Glucose: 84 mg/dL (ref 65–99)
Potassium: 4.7 mmol/L (ref 3.5–5.2)
Sodium: 137 mmol/L (ref 134–144)
Total Protein: 6.8 g/dL (ref 6.0–8.5)
eGFR: 90 mL/min/{1.73_m2} (ref >59)

## 2020-10-10 ENCOUNTER — Other Ambulatory Visit: Payer: Medicare HMO

## 2020-10-10 ENCOUNTER — Telehealth: Payer: Self-pay | Admitting: Family Medicine

## 2020-10-10 NOTE — Telephone Encounter (Signed)
Called.  Still mild transaminiitis.  He will make an appointment to see me.  In the meantime, he will hold his multiple supplements.

## 2020-11-02 ENCOUNTER — Telehealth: Payer: Self-pay | Admitting: Family Medicine

## 2020-11-02 NOTE — Telephone Encounter (Signed)
Called wife.  She expressed general concerns about declining health.  I will take that into account for his workup.

## 2020-11-02 NOTE — Telephone Encounter (Signed)
-----   Message from Dorna Bloom, [place] sent at 11/02/2020  2:30 PM EDT ----- Hey Dr. Nunzio Cory, patients wife, called the nurse line requesting you to call her. The patient has an apt with you tomorrow, however she would like to talk with you "discreetly" about some things going on he may not share.   Her cell number is [phone]

## 2020-11-03 ENCOUNTER — Encounter: Payer: Self-pay | Admitting: Family Medicine

## 2020-11-03 ENCOUNTER — Other Ambulatory Visit: Payer: Self-pay

## 2020-11-03 ENCOUNTER — Ambulatory Visit (INDEPENDENT_AMBULATORY_CARE_PROVIDER_SITE_OTHER): Payer: Medicare HMO | Admitting: Family Medicine

## 2020-11-03 VITALS — BP 108/68 | HR 54 | Ht 73.0 in | Wt 184.0 lb

## 2020-11-03 DIAGNOSIS — Z23 Encounter for immunization: Secondary | ICD-10-CM | POA: Diagnosis not present

## 2020-11-03 DIAGNOSIS — L570 Actinic keratosis: Secondary | ICD-10-CM | POA: Diagnosis not present

## 2020-11-03 DIAGNOSIS — L814 Other melanin hyperpigmentation: Secondary | ICD-10-CM | POA: Diagnosis not present

## 2020-11-03 DIAGNOSIS — R7401 Elevation of levels of liver transaminase levels: Secondary | ICD-10-CM | POA: Diagnosis not present

## 2020-11-03 DIAGNOSIS — D225 Melanocytic nevi of trunk: Secondary | ICD-10-CM | POA: Diagnosis not present

## 2020-11-03 DIAGNOSIS — L821 Other seborrheic keratosis: Secondary | ICD-10-CM | POA: Diagnosis not present

## 2020-11-03 NOTE — Patient Instructions (Signed)
Good luck with the [place] dental surgery.   The results should be available tomorrow on MyChart.  I won't be able to call until Monday. If there is any remaining liver inflammation, I will likely want to order an ultrasound of your liver. I doubt that this will turn out to be a problem, but I am being cautious.

## 2020-11-03 NOTE — Assessment & Plan Note (Addendum)
REcheck LFTs.  Consider imaging and further WU if escalating. Still with mild elevation.  Will order RUQ ultrasound

## 2020-11-03 NOTE — Progress Notes (Signed)
    SUBJECTIVE:   CHIEF COMPLAINT / HPI:   FU transaminitis.  Very mild elevation of ALT.  No symptoms.  States he feels great.  Good energy, no change in appetite, no abd pain.  Quit drinking more than a year ago.  Since last measured, he quit taking multiple supplements.  He has had weight loss but it has been intentional.  He has not had any imaging.    He is looking forward to a trip to [place] where he will have dental implants inserted.  (Medical/dental tourism.)  OBJECTIVE:   BP 108/68   Pulse (!) 54   Ht 6\' 1"  (1.854 m)   Wt 184 lb (83.5 kg)   SpO2 100%   BMI 24.28 kg/m   Lungs clear Cardiac RRR without m or g Abd benign  ASSESSMENT/PLAN:   Transaminitis REcheck LFTs.  Consider imaging and further WU if escalating.     Zenia Resides, MD [place]

## 2020-11-04 LAB — CMP14+EGFR
ALT: 48 IU/L — ABNORMAL HIGH (ref 0–44)
AST: 30 IU/L (ref 0–40)
Albumin/Globulin Ratio: 1.9 (ref 1.2–2.2)
Albumin: 4.5 g/dL (ref 3.7–4.7)
Alkaline Phosphatase: 98 IU/L (ref 44–121)
BUN/Creatinine Ratio: 15 (ref 10–24)
BUN: 12 mg/dL (ref 8–27)
Bilirubin Total: 0.5 mg/dL (ref 0.0–1.2)
CO2: 20 mmol/L (ref 20–29)
Calcium: 9.1 mg/dL (ref 8.6–10.2)
Chloride: 98 mmol/L (ref 96–106)
Creatinine, Ser: 0.82 mg/dL (ref 0.76–1.27)
Globulin, Total: 2.4 g/dL (ref 1.5–4.5)
Glucose: 85 mg/dL (ref 65–99)
Potassium: 4.8 mmol/L (ref 3.5–5.2)
Sodium: 134 mmol/L (ref 134–144)
Total Protein: 6.9 g/dL (ref 6.0–8.5)
eGFR: 92 mL/min/{1.73_m2} (ref >59)

## 2020-11-07 NOTE — Addendum Note (Signed)
Addended by: Zenia Resides on: 11/07/2020 08:39 AM   Modules accepted: Orders

## 2020-11-22 ENCOUNTER — Ambulatory Visit
Admission: RE | Admit: 2020-11-22 | Discharge: 2020-11-22 | Disposition: A | Discharge status: Home / Self Care | Payer: Medicare HMO | Source: Ambulatory Visit | Attending: Family Medicine | Admitting: Family Medicine

## 2020-11-22 DIAGNOSIS — K7689 Other specified diseases of liver: Secondary | ICD-10-CM | POA: Diagnosis not present

## 2020-11-22 DIAGNOSIS — R7401 Elevation of levels of liver transaminase levels: Secondary | ICD-10-CM

## 2020-11-23 ENCOUNTER — Other Ambulatory Visit: Payer: Self-pay | Admitting: Family Medicine

## 2020-11-23 DIAGNOSIS — R7401 Elevation of levels of liver transaminase levels: Secondary | ICD-10-CM

## 2020-11-25 ENCOUNTER — Telehealth: Payer: Self-pay | Admitting: *Deleted

## 2020-11-25 NOTE — Telephone Encounter (Signed)
I received a call from April with [facility] [phone]) wanting to clarify orders placed for this patient.  She said that patient had a RUQ ultrasound on 11/22/20 and now there is an order for liver elastography.  She is curious if this is needing to be done now or is it a future order for 6 months.  Will forward to MD to clarify, so it can be scheduled accordingly.

## 2020-11-25 NOTE — Telephone Encounter (Signed)
Sounds good.  I left a message for April letting her know.  [name],CMA

## 2020-11-28 ENCOUNTER — Other Ambulatory Visit: Payer: Medicare HMO

## 2020-12-01 ENCOUNTER — Other Ambulatory Visit (HOSPITAL_COMMUNITY): Payer: Self-pay | Admitting: Family Medicine

## 2020-12-01 ENCOUNTER — Telehealth: Payer: Self-pay | Admitting: *Deleted

## 2020-12-01 DIAGNOSIS — R7401 Elevation of levels of liver transaminase levels: Secondary | ICD-10-CM

## 2020-12-01 NOTE — Telephone Encounter (Signed)
I have pt scheduled for Tuesday Oct 25th at 9:00 am. Arrive at the Radiology dept by 845. Called pt and confirmed this will work for him. Pt agreed to day and time. Will ask Dr.Hensel to put in a new order for [facility]. I will fax the new order to [phone] once it has been placed. Ottis Stain, CMA

## 2020-12-01 NOTE — Telephone Encounter (Signed)
New order entered

## 2020-12-01 NOTE — Telephone Encounter (Signed)
Wife called today stating that patient's ultrasound has been rescheduled again due to staffing issues.  She would like to know if we can call over the the hospital to see if they could work him in sooner than 12/09/2020.  He is currently scheduled at [facility].  Will forward to team to check on this and call patient back with an update on his cell [phone].  Amauria Younts,CMA

## 2020-12-02 ENCOUNTER — Other Ambulatory Visit: Payer: Medicare HMO

## 2020-12-05 ENCOUNTER — Other Ambulatory Visit: Payer: Self-pay

## 2020-12-05 ENCOUNTER — Ambulatory Visit
Admission: RE | Admit: 2020-12-05 | Discharge: 2020-12-05 | Disposition: A | Discharge status: Home / Self Care | Payer: Medicare HMO | Source: Ambulatory Visit | Attending: Family Medicine | Admitting: Family Medicine

## 2020-12-05 DIAGNOSIS — R7401 Elevation of levels of liver transaminase levels: Secondary | ICD-10-CM

## 2020-12-05 DIAGNOSIS — K7689 Other specified diseases of liver: Secondary | ICD-10-CM | POA: Diagnosis not present

## 2020-12-06 ENCOUNTER — Ambulatory Visit: Payer: Medicare HMO

## 2020-12-06 ENCOUNTER — Ambulatory Visit: Payer: BC Managed Care – PPO | Attending: Internal Medicine

## 2020-12-06 ENCOUNTER — Ambulatory Visit (HOSPITAL_COMMUNITY): Payer: Medicare HMO

## 2020-12-06 DIAGNOSIS — Z23 Encounter for immunization: Secondary | ICD-10-CM

## 2020-12-06 NOTE — Progress Notes (Signed)
   Covid-19 Vaccination Clinic  Name:  Sheriff Rodenberg    MRN: [id number] DOB: 1945/10/02  12/06/2020  Mr. Michelsen was observed post Covid-19 immunization for 15 minutes without incident. He was provided with Vaccine Information Sheet and instruction to access the V-Safe system.   Mr. Cansler was instructed to call 911 with any severe reactions post vaccine: Difficulty breathing  Swelling of face and throat  A fast heartbeat  A bad rash all over body  Dizziness and weakness   Immunizations Administered     Name Date Dose VIS Date Route   Pfizer Covid-19 Vaccine Bivalent Booster 12/06/2020 12:57 PM 0.3 mL 10/12/2020 Intramuscular   Manufacturer: [place]   Lot: [id number]   [place]: [phone]

## 2020-12-09 ENCOUNTER — Other Ambulatory Visit: Payer: Medicare HMO

## 2020-12-30 ENCOUNTER — Other Ambulatory Visit (HOSPITAL_BASED_OUTPATIENT_CLINIC_OR_DEPARTMENT_OTHER): Payer: Self-pay

## 2020-12-30 MED ORDER — PFIZER COVID-19 VAC BIVALENT 30 MCG/0.3ML IM SUSP
INTRAMUSCULAR | 0 refills | Status: DC
  Filled 2020-12-30: qty 0.3, 1d supply, fill #0

## 2021-05-18 ENCOUNTER — Telehealth: Payer: Self-pay | Admitting: *Deleted

## 2021-05-18 DIAGNOSIS — M19072 Primary osteoarthritis, left ankle and foot: Secondary | ICD-10-CM

## 2021-05-18 MED ORDER — NORTRIPTYLINE HCL 25 MG PO CAPS: 25.0000 mg | ORAL_CAPSULE | Freq: Two times a day (BID) | ORAL | 3 refills | Status: DC

## 2021-05-18 NOTE — Telephone Encounter (Signed)
Pt LM on RN line to request a script for nortriptyline as it "helps tremendously with my legs". ? ?Attempted to call back and get more info, but had to [place]. Christen Bame, CMA ? ?

## 2021-05-18 NOTE — Telephone Encounter (Signed)
Called.  Patient took some of his wife's nortryptaline and it gave good relief of chronic foot pain.  Verified he took 25 mg dose.  Warned about possible orthostatic side effects.  Will prescribe as requested ?

## 2021-05-29 DIAGNOSIS — L570 Actinic keratosis: Secondary | ICD-10-CM | POA: Diagnosis not present

## 2021-05-29 DIAGNOSIS — D225 Melanocytic nevi of trunk: Secondary | ICD-10-CM | POA: Diagnosis not present

## 2021-05-29 DIAGNOSIS — R233 Spontaneous ecchymoses: Secondary | ICD-10-CM | POA: Diagnosis not present

## 2021-05-29 DIAGNOSIS — L821 Other seborrheic keratosis: Secondary | ICD-10-CM | POA: Diagnosis not present

## 2021-05-29 DIAGNOSIS — L814 Other melanin hyperpigmentation: Secondary | ICD-10-CM | POA: Diagnosis not present

## 2021-07-02 ENCOUNTER — Other Ambulatory Visit: Payer: Self-pay | Admitting: Family Medicine

## 2021-07-02 DIAGNOSIS — E7800 Pure hypercholesterolemia, unspecified: Secondary | ICD-10-CM

## 2021-07-06 ENCOUNTER — Ambulatory Visit (INDEPENDENT_AMBULATORY_CARE_PROVIDER_SITE_OTHER): Payer: Medicare PPO | Admitting: Family Medicine

## 2021-07-06 ENCOUNTER — Encounter: Payer: Self-pay | Admitting: Family Medicine

## 2021-07-06 DIAGNOSIS — R7401 Elevation of levels of liver transaminase levels: Secondary | ICD-10-CM | POA: Diagnosis not present

## 2021-07-06 DIAGNOSIS — Z0000 Encounter for general adult medical examination without abnormal findings: Secondary | ICD-10-CM | POA: Diagnosis not present

## 2021-07-06 DIAGNOSIS — E7800 Pure hypercholesterolemia, unspecified: Secondary | ICD-10-CM | POA: Diagnosis not present

## 2021-07-06 NOTE — Progress Notes (Signed)
Two weeks [place]; picked up cough; no fever, headache; possibly associated "inner ear problem" exacerbated with flight. + watery eyes, few days of low energy Overall feeling good; no cp, SOB, headache, GI issues; 4-5 times waking up in night to pee, no burning/pain; BMs are largely normal, though sometimes has constipation lasting a few days prior to evacuating; no vertigo, but does sometimes get light headed when first standing Sleeping well, around 7 hours, feeling energized upon waking Walking regularly, does have to walk slowly (2 miles a day); having pain in left ankle with walking but Pamelor helps with this; not really much knee pain, some thigh pain  I was either physically present or repeated the history.  I agree with the documentation of MS [place].

## 2021-07-06 NOTE — Patient Instructions (Signed)
I am retiring in less than one year.  We will have another doctor assigned to you when I retire. Decide whether or not you want to have cologuard again. I will call with your lab work. Keep living your best life.

## 2021-07-07 ENCOUNTER — Encounter: Payer: Self-pay | Admitting: Family Medicine

## 2021-07-07 LAB — CMP14+EGFR
ALT: 63 IU/L — ABNORMAL HIGH (ref 0–44)
AST: 57 IU/L — ABNORMAL HIGH (ref 0–40)
Albumin/Globulin Ratio: 1.6 (ref 1.2–2.2)
Albumin: 4.2 g/dL (ref 3.7–4.7)
Alkaline Phosphatase: 146 IU/L — ABNORMAL HIGH (ref 44–121)
BUN/Creatinine Ratio: 16 (ref 10–24)
BUN: 14 mg/dL (ref 8–27)
Bilirubin Total: 0.6 mg/dL (ref 0.0–1.2)
CO2: 21 mmol/L (ref 20–29)
Calcium: 8.8 mg/dL (ref 8.6–10.2)
Chloride: 96 mmol/L (ref 96–106)
Creatinine, Ser: 0.86 mg/dL (ref 0.76–1.27)
Globulin, Total: 2.7 g/dL (ref 1.5–4.5)
Glucose: 103 mg/dL — ABNORMAL HIGH (ref 70–99)
Potassium: 4.6 mmol/L (ref 3.5–5.2)
Sodium: 131 mmol/L — ABNORMAL LOW (ref 134–144)
Total Protein: 6.9 g/dL (ref 6.0–8.5)
eGFR: 90 mL/min/{1.73_m2} (ref >59)

## 2021-07-07 LAB — LIPID PANEL
Chol/HDL Ratio: 1.7 ratio (ref 0.0–5.0)
Cholesterol, Total: 128 mg/dL (ref 100–199)
HDL: 76 mg/dL (ref >39)
LDL Chol Calc (NIH): 42 mg/dL (ref 0–99)
Triglycerides: 36 mg/dL (ref 0–149)
VLDL Cholesterol Cal: 10 mg/dL (ref 5–40)

## 2021-07-07 NOTE — Progress Notes (Signed)
    SUBJECTIVE:   CHIEF COMPLAINT / HPI:   Annual exam Acute problem, lingering cough after long trip to [place].  No fever, weight loss or SOB.   Chronic problems Hypercholesterolemai.  On statin.  Primary prevention.  Not diabetic Transaminitis.  Mild.  Elastography in 10/22.  Asymptomatic.  Weight good.  EtOH intake is still present but less than his peak. HPDP: Up to date on immunizations.  We discussed possibly one more round of colon cancer screening because of his good health status.  PERTINENT  PMH / PSH: No chest pain, SOB, bleeding, wt loss, worrisome skin changes.  Denies change in bowel, bladder or appetite,  OBJECTIVE:   BP 119/71   Pulse 61   Ht '6\' 1"'$  (1.854 m)   Wt 187 lb 6.4 oz (85 kg)   SpO2 100%   BMI 24.72 kg/m   Neck supple Lungs clear Cardiac RRR without m or g Abd benign Ext no edema Neuro, motor, sensory, gait, cognition and affect are all grossly normal.  ASSESSMENT/PLAN:   No problem-specific Assessment & Plan notes found for this encounter.     Zenia Resides, MD [place]

## 2021-07-07 NOTE — Assessment & Plan Note (Signed)
Healthy male with good lifestyle.  Because he has an excellent chance of living >10 years, he is a good candidate for continued colon cancer screening.

## 2021-07-07 NOTE — Assessment & Plan Note (Signed)
Patient informed that lipid panel numbers are great.  Continue statin.

## 2021-07-07 NOTE — Assessment & Plan Note (Signed)
Unfortunately, lab shows mild elevation.  Reviewed imaging of 10/22.  Informed patient and will repeat LFTs in 4-6 months.

## 2021-07-18 ENCOUNTER — Encounter: Payer: Self-pay | Admitting: *Deleted

## 2021-12-04 DIAGNOSIS — L821 Other seborrheic keratosis: Secondary | ICD-10-CM | POA: Diagnosis not present

## 2021-12-04 DIAGNOSIS — L570 Actinic keratosis: Secondary | ICD-10-CM | POA: Diagnosis not present

## 2021-12-04 DIAGNOSIS — L814 Other melanin hyperpigmentation: Secondary | ICD-10-CM | POA: Diagnosis not present

## 2021-12-04 DIAGNOSIS — D225 Melanocytic nevi of trunk: Secondary | ICD-10-CM | POA: Diagnosis not present

## 2021-12-25 ENCOUNTER — Telehealth: Payer: Self-pay | Admitting: Family Medicine

## 2021-12-25 DIAGNOSIS — R7401 Elevation of levels of liver transaminase levels: Secondary | ICD-10-CM

## 2021-12-25 NOTE — Telephone Encounter (Signed)
Patient called to schedule lab appointment, he stated for labs that doctor wants him to have. I have patient scheduled for 01/17/2022 at 8:30AM. If doctor could just place the orders. Patient stated if anyone needed to speak to him they could call.   Please advise.  Thanks!

## 2021-12-27 NOTE — Telephone Encounter (Signed)
After reviewing blood work from May 2023, labs ordered as requested.

## 2022-01-17 ENCOUNTER — Other Ambulatory Visit: Payer: Medicare PPO

## 2022-01-17 DIAGNOSIS — R7401 Elevation of levels of liver transaminase levels: Secondary | ICD-10-CM | POA: Diagnosis not present

## 2022-01-18 LAB — CMP14+EGFR
ALT: 41 IU/L (ref 0–44)
AST: 35 IU/L (ref 0–40)
Albumin/Globulin Ratio: 2.2 (ref 1.2–2.2)
Albumin: 4.9 g/dL — ABNORMAL HIGH (ref 3.8–4.8)
Alkaline Phosphatase: 112 IU/L (ref 44–121)
BUN/Creatinine Ratio: 14 (ref 10–24)
BUN: 13 mg/dL (ref 8–27)
Bilirubin Total: 0.5 mg/dL (ref 0.0–1.2)
CO2: 23 mmol/L (ref 20–29)
Calcium: 9.5 mg/dL (ref 8.6–10.2)
Chloride: 100 mmol/L (ref 96–106)
Creatinine, Ser: 0.92 mg/dL (ref 0.76–1.27)
Globulin, Total: 2.2 g/dL (ref 1.5–4.5)
Glucose: 96 mg/dL (ref 70–99)
Potassium: 4.8 mmol/L (ref 3.5–5.2)
Sodium: 137 mmol/L (ref 134–144)
Total Protein: 7.1 g/dL (ref 6.0–8.5)
eGFR: 86 mL/min/{1.73_m2} (ref >59)

## 2022-03-20 ENCOUNTER — Encounter: Payer: Self-pay | Admitting: Family Medicine

## 2022-05-23 ENCOUNTER — Other Ambulatory Visit: Payer: Self-pay

## 2022-05-23 DIAGNOSIS — M19072 Primary osteoarthritis, left ankle and foot: Secondary | ICD-10-CM

## 2022-05-23 MED ORDER — NORTRIPTYLINE HCL 25 MG PO CAPS: 25.0000 mg | ORAL_CAPSULE | Freq: Two times a day (BID) | ORAL | 0 refills | Status: DC

## 2022-05-23 NOTE — Telephone Encounter (Signed)
greater trochanter I received a request for refill nortptyline and have refilled. In reading through his chart, he is due for a f/u visit in next month or two so please remind him of  that as I am his new doctor and would like to meet him. He is also due a check on his LFts. THANKS! Denny Levy

## 2022-05-24 NOTE — Telephone Encounter (Signed)
Called and spoke to patient informing him of RX and the need to make an appointment in the next month or two per Dr. Jennette Kettle.  Patient says that he will make a note of it and call back later to schedule after he checks his calendar.  Glennie Hawk, CMA

## 2022-06-07 ENCOUNTER — Other Ambulatory Visit: Payer: Self-pay

## 2022-06-07 DIAGNOSIS — E7800 Pure hypercholesterolemia, unspecified: Secondary | ICD-10-CM

## 2022-06-07 MED ORDER — ATORVASTATIN CALCIUM 40 MG PO TABS: 40.0000 mg | ORAL_TABLET | Freq: Every day | ORAL | 3 refills | Status: DC

## 2022-06-20 ENCOUNTER — Telehealth: Payer: Self-pay | Admitting: Family Medicine

## 2022-06-20 NOTE — Telephone Encounter (Signed)
Called patient to schedule Medicare Annual Wellness Visit (AWV). Left message for patient to call back and schedule Medicare Annual Wellness Visit (AWV).  Last date of AWV: AWVI eligible as of 05/14/2011   Please schedule an AWVI appointment at any time with [facility] VISIT.  If any questions, please contact me at [phone].    Thank you,  [facility] Direct dial  [phone]

## 2022-07-11 ENCOUNTER — Ambulatory Visit: Payer: Medicare PPO | Admitting: Family Medicine

## 2022-07-11 ENCOUNTER — Encounter: Payer: Self-pay | Admitting: Family Medicine

## 2022-07-11 VITALS — BP 118/68 | HR 76 | Ht 73.0 in | Wt 184.8 lb

## 2022-07-11 DIAGNOSIS — G4700 Insomnia, unspecified: Secondary | ICD-10-CM

## 2022-07-11 DIAGNOSIS — E7800 Pure hypercholesterolemia, unspecified: Secondary | ICD-10-CM | POA: Diagnosis not present

## 2022-07-11 DIAGNOSIS — M19072 Primary osteoarthritis, left ankle and foot: Secondary | ICD-10-CM | POA: Diagnosis not present

## 2022-07-11 NOTE — Patient Instructions (Signed)
Great to meet you!  I will send you a note in MyChart about your labs.

## 2022-07-12 ENCOUNTER — Encounter: Payer: Self-pay | Admitting: Family Medicine

## 2022-07-12 LAB — COMPREHENSIVE METABOLIC PANEL
ALT: 52 IU/L — ABNORMAL HIGH (ref 0–44)
AST: 38 IU/L (ref 0–40)
Albumin/Globulin Ratio: 1.9 (ref 1.2–2.2)
Albumin: 4.7 g/dL (ref 3.8–4.8)
Alkaline Phosphatase: 101 IU/L (ref 44–121)
BUN/Creatinine Ratio: 14 (ref 10–24)
BUN: 14 mg/dL (ref 8–27)
Bilirubin Total: 0.5 mg/dL (ref 0.0–1.2)
CO2: 21 mmol/L (ref 20–29)
Calcium: 9.7 mg/dL (ref 8.6–10.2)
Chloride: 102 mmol/L (ref 96–106)
Creatinine, Ser: 0.97 mg/dL (ref 0.76–1.27)
Globulin, Total: 2.5 g/dL (ref 1.5–4.5)
Glucose: 97 mg/dL (ref 70–99)
Potassium: 4.5 mmol/L (ref 3.5–5.2)
Sodium: 138 mmol/L (ref 134–144)
Total Protein: 7.2 g/dL (ref 6.0–8.5)
eGFR: 80 mL/min/{1.73_m2} (ref >59)

## 2022-07-12 LAB — LIPID PANEL
Chol/HDL Ratio: 2.1 ratio (ref 0.0–5.0)
Cholesterol, Total: 154 mg/dL (ref 100–199)
HDL: 74 mg/dL (ref >39)
LDL Chol Calc (NIH): 69 mg/dL (ref 0–99)
Triglycerides: 54 mg/dL (ref 0–149)
VLDL Cholesterol Cal: 11 mg/dL (ref 5–40)

## 2022-07-12 NOTE — Assessment & Plan Note (Signed)
Reviewed his last cholesterol panel and medications.  He is having no problems with his current medication so we will continue that.  Check labs.

## 2022-07-12 NOTE — Assessment & Plan Note (Signed)
Seems to only have issues with insomnia when he is traveling and has successfully used low-dose benzodiazepine intermittently for that in the past.  I will be happy to continue that.  He does not have any significant travel coming up in the near future.

## 2022-07-12 NOTE — Progress Notes (Signed)
    CHIEF COMPLAINT / HPI:   Patient entering my continuity practice after the retirement of Dr. Leveda Anna.  Mostly here just to get snowman to check up on a few things.  No specific current issues. 1.  He continues to be quite active.  Follows regular exercise.  Tries to eat a good diet and has evidently recently significantly decreased his alcohol consumption so much so that he is last lab work reflected that as he has very mild elevation of LFTs had resolved or improved.  He also notes that he feels better in general not drinking alcohol on a regular basis.. 2.  He travels internationally several times a year and usually takes some alprazolam for those strips so he can sleep on the plane.  Otherwise the only medications he takes are atorvastatin for his cholesterol and nortriptyline which is for leg pain associated with walking.  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   PERTINENT  PMH / PSH: I have reviewed the patient's medications, allergies, past medical and surgical history, smoking status and updated in the EMR as appropriate. Retired Programmer, systems, former principal.  Music therapist.  OBJECTIVE:  BP 118/68   Pulse 76   Ht 6\' 1"  (1.854 m)   Wt 184 lb 12.8 oz (83.8 kg)   SpO2 98%   BMI 24.38 kg/m  Vital signs reviewed. GENERAL: Well-developed, well-nourished, no acute distress. CARDIOVASCULAR: Regular rate and rhythm no murmur gallop or rub LUNGS: Clear to auscultation bilaterally, no rales or wheeze. ABDOMEN: Soft  NEURO: No gross focal neurological deficits. MSK: Movement of extremity x 4. PSYCH: AxOx4. Good eye contact.. No psychomotor retardation or agitation. Appropriate speech fluency and content. Asks and answers questions appropriately. Mood is congruent.    ASSESSMENT / PLAN:   HYPERCHOLESTEROLEMIA, PURE Reviewed his last cholesterol panel and medications.  He is having no problems with his current medication  so we will continue that.  Check labs.  Arthritis of left ankle Has been using some nortriptyline for lower extremity pain on the left and this seems to work extremely well for him so we will continue that as needed.  Insomnia Seems to only have issues with insomnia when he is traveling and has successfully used low-dose benzodiazepine intermittently for that in the past.  I will be happy to continue that.  He does not have any significant travel coming up in the near future.   Denny Levy MD

## 2022-07-12 NOTE — Assessment & Plan Note (Signed)
Has been using some nortriptyline for lower extremity pain on the left and this seems to work extremely well for him so we will continue that as needed.

## 2022-08-18 ENCOUNTER — Other Ambulatory Visit: Payer: Self-pay | Admitting: Family Medicine

## 2022-08-18 DIAGNOSIS — M19072 Primary osteoarthritis, left ankle and foot: Secondary | ICD-10-CM

## 2022-09-21 ENCOUNTER — Ambulatory Visit (INDEPENDENT_AMBULATORY_CARE_PROVIDER_SITE_OTHER): Payer: Medicare PPO

## 2022-09-21 VITALS — Ht 73.0 in | Wt 184.0 lb

## 2022-09-21 DIAGNOSIS — Z0000 Encounter for general adult medical examination without abnormal findings: Secondary | ICD-10-CM

## 2022-09-21 NOTE — Patient Instructions (Signed)
[name] , Thank you for taking time to come for your Medicare Wellness Visit. I appreciate your ongoing commitment to your health goals. Please review the following plan we discussed and let me know if I can assist you in the future.   Referrals/Orders/Follow-Ups/Clinician Recommendations: Aim for 30 minutes of exercise or brisk walking, 6-8 glasses of water, and 5 servings of fruits and vegetables each day.  Enjoy your trip!!   This is a list of the screening recommended for you and due dates:  Health Maintenance  Topic Date Due   COVID-19 Vaccine (7 - 2023-24 season) 01/05/2022   Flu Shot  09/13/2022   Medicare Annual Wellness Visit  09/21/2023   DTaP/Tdap/Td vaccine (4 - Td or Tdap) 03/07/2027   Pneumonia Vaccine  Completed   Hepatitis C Screening  Completed   Zoster (Shingles) Vaccine  Completed   HPV Vaccine  Aged Out   Cologuard (Stool DNA test)  Discontinued    Advanced directives: (ACP Link)Information on Advanced Care Planning can be found at [facility] [place] Advance Health Care Directives Advance Health Care Directives ([email or web address]   Next Medicare Annual Wellness Visit scheduled for next year: Yes  Preventive Care 65 Years and Older, Male  Preventive care refers to lifestyle choices and visits with your health care provider that can promote health and wellness. What does preventive care include? A yearly physical exam. This is also called an annual well check. Dental exams once or twice a year. Routine eye exams. Ask your health care provider how often you should have your eyes checked. Personal lifestyle choices, including: Daily care of your teeth and gums. Regular physical activity. Eating a healthy diet. Avoiding tobacco and drug use. Limiting alcohol use. Practicing safe sex. Taking low doses of aspirin every day. Taking vitamin and mineral supplements as recommended by your health care provider. What happens during an annual well check? The  services and screenings done by your health care provider during your annual well check will depend on your age, overall health, lifestyle risk factors, and family history of disease. Counseling  Your health care provider may ask you questions about your: Alcohol use. Tobacco use. Drug use. Emotional well-being. Home and relationship well-being. Sexual activity. Eating habits. History of falls. Memory and ability to understand (cognition). Work and work Astronomer. Screening  You may have the following tests or measurements: Height, weight, and BMI. Blood pressure. Lipid and cholesterol levels. These may be checked every 5 years, or more frequently if you are over 58 years old. Skin check. Lung cancer screening. You may have this screening every year starting at age 53 if you have a 30-pack-year history of smoking and currently smoke or have quit within the past 15 years. Fecal occult blood test (FOBT) of the stool. You may have this test every year starting at age 55. Flexible sigmoidoscopy or colonoscopy. You may have a sigmoidoscopy every 5 years or a colonoscopy every 10 years starting at age 38. Prostate cancer screening. Recommendations will vary depending on your family history and other risks. Hepatitis C blood test. Hepatitis B blood test. Sexually transmitted disease (STD) testing. Diabetes screening. This is done by checking your blood sugar (glucose) after you have not eaten for a while (fasting). You may have this done every 1-3 years. Abdominal aortic aneurysm (AAA) screening. You may need this if you are a current or former smoker. Osteoporosis. You may be screened starting at age 10 if you are at high risk. Talk  with your health care provider about your test results, treatment options, and if necessary, the need for more tests. Vaccines  Your health care provider may recommend certain vaccines, such as: Influenza vaccine. This is recommended every year. Tetanus,  diphtheria, and acellular pertussis (Tdap, Td) vaccine. You may need a Td booster every 10 years. Zoster vaccine. You may need this after age 83. Pneumococcal 13-valent conjugate (PCV13) vaccine. One dose is recommended after age 75. Pneumococcal polysaccharide (PPSV23) vaccine. One dose is recommended after age 31. Talk to your health care provider about which screenings and vaccines you need and how often you need them. This information is not intended to replace advice given to you by your health care provider. Make sure you discuss any questions you have with your health care provider. Document Released: 02/25/2015 Document Revised: 10/19/2015 Document Reviewed: 11/30/2014 Elsevier Interactive Patient Education  2017 [employer].  Fall Prevention in the Home Falls can cause injuries. They can happen to people of all ages. There are many things you can do to make your home safe and to help prevent falls. What can I do on the outside of my home? Regularly fix the edges of walkways and driveways and fix any cracks. Remove anything that might make you trip as you walk through a door, such as a raised step or threshold. Trim any bushes or trees on the path to your home. Use bright outdoor lighting. Clear any walking paths of anything that might make someone trip, such as rocks or tools. Regularly check to see if handrails are loose or broken. Make sure that both sides of any steps have handrails. Any raised decks and porches should have guardrails on the edges. Have any leaves, snow, or ice cleared regularly. Use sand or salt on walking paths during winter. Clean up any spills in your garage right away. This includes oil or grease spills. What can I do in the bathroom? Use night lights. Install grab bars by the toilet and in the tub and shower. Do not use towel bars as grab bars. Use non-skid mats or decals in the tub or shower. If you need to sit down in the shower, use a plastic,  non-slip stool. Keep the floor dry. Clean up any water that spills on the floor as soon as it happens. Remove soap buildup in the tub or shower regularly. Attach bath mats securely with double-sided non-slip rug tape. Do not have throw rugs and other things on the floor that can make you trip. What can I do in the bedroom? Use night lights. Make sure that you have a light by your bed that is easy to reach. Do not use any sheets or blankets that are too big for your bed. They should not hang down onto the floor. Have a firm chair that has side arms. You can use this for support while you get dressed. Do not have throw rugs and other things on the floor that can make you trip. What can I do in the kitchen? Clean up any spills right away. Avoid walking on wet floors. Keep items that you use a lot in easy-to-reach places. If you need to reach something above you, use a strong step stool that has a grab bar. Keep electrical cords out of the way. Do not use floor polish or wax that makes floors slippery. If you must use wax, use non-skid floor wax. Do not have throw rugs and other things on the floor that can make you  trip. What can I do with my stairs? Do not leave any items on the stairs. Make sure that there are handrails on both sides of the stairs and use them. Fix handrails that are broken or loose. Make sure that handrails are as long as the stairways. Check any carpeting to make sure that it is firmly attached to the stairs. Fix any carpet that is loose or worn. Avoid having throw rugs at the top or bottom of the stairs. If you do have throw rugs, attach them to the floor with carpet tape. Make sure that you have a light switch at the top of the stairs and the bottom of the stairs. If you do not have them, ask someone to add them for you. What else can I do to help prevent falls? Wear shoes that: Do not have high heels. Have rubber bottoms. Are comfortable and fit you well. Are closed  at the toe. Do not wear sandals. If you use a stepladder: Make sure that it is fully opened. Do not climb a closed stepladder. Make sure that both sides of the stepladder are locked into place. Ask someone to hold it for you, if possible. Clearly mark and make sure that you can see: Any grab bars or handrails. First and last steps. Where the edge of each step is. Use tools that help you move around (mobility aids) if they are needed. These include: Canes. Walkers. Scooters. Crutches. Turn on the lights when you go into a dark area. Replace any light bulbs as soon as they burn out. Set up your furniture so you have a clear path. Avoid moving your furniture around. If any of your floors are uneven, fix them. If there are any pets around you, be aware of where they are. Review your medicines with your doctor. Some medicines can make you feel dizzy. This can increase your chance of falling. Ask your doctor what other things that you can do to help prevent falls. This information is not intended to replace advice given to you by your health care provider. Make sure you discuss any questions you have with your health care provider. Document Released: 11/25/2008 Document Revised: 07/07/2015 Document Reviewed: 03/05/2014 Elsevier Interactive Patient Education  2017 [employer].

## 2022-09-21 NOTE — Progress Notes (Signed)
Subjective:   Isaac Crane is a 77 y.o. male who presents for an Initial Medicare Annual Wellness Visit.  Visit Complete: Virtual  I connected with  Isaac Crane on 09/21/22 by a audio enabled telemedicine application and verified that I am speaking with the correct person using two identifiers.  Patient Location: Home  Provider Location: Home Office  I discussed the limitations of evaluation and management by telemedicine. The patient expressed understanding and agreed to proceed.  Vital Signs: Unable to obtain new vitals due to this being a telehealth visit.  Review of Systems     Cardiac Risk Factors include: advanced age (>43men, >88 women);male gender;dyslipidemia     Objective:    Today's Vitals   09/21/22 0944  Weight: 184 lb (83.5 kg)  Height: 6\' 1"  (1.854 m)   Body mass index is 24.28 kg/m.     09/21/2022    4:31 PM 11/03/2020   11:26 AM 02/17/2018    8:31 AM 03/06/2017    1:51 PM 08/31/2014   11:05 AM 04/09/2014   10:00 AM  Advanced Directives  Does Patient Have a Medical Advance Directive? No No No No No No  Would patient like information on creating a medical advance directive? Yes (MAU/Ambulatory/Procedural Areas - Information given) No - Patient declined  No - Patient declined Yes - Educational materials given No - patient declined information    Current Medications (verified) Outpatient Encounter Medications as of 09/21/2022  Medication Sig   atorvastatin (LIPITOR) 40 MG tablet Take 1 tablet (40 mg total) by mouth daily.   nortriptyline (PAMELOR) 25 MG capsule TAKE 1 CAPSULE (25 MG TOTAL) BY MOUTH 2 (TWO) TIMES DAILY. FOR NEUROPATHIC FOOT PAIN   ALPRAZolam (XANAX) 0.5 MG tablet Take 1 tablet (0.5 mg total) by mouth at bedtime as needed. For sleep while traveling.  May repeat a dose after 30 minutes if no effect. (Patient not taking: Reported on 09/21/2022)   No facility-administered encounter medications on file as of 09/21/2022.    Allergies  (verified) Other   History: No past medical history on file. No past surgical history on file. No family history on file. Social History   Socioeconomic History   Marital status: Married    Spouse name: Not on file   Number of children: Not on file   Years of education: Not on file   Highest education level: Not on file  Occupational History   Not on file  Tobacco Use   Smoking status: Never   Smokeless tobacco: Never  Substance and Sexual Activity   Alcohol use: Yes    Alcohol/week: 42.0 standard drinks of alcohol    Types: 42 drink(s) per week   Drug use: No   Sexual activity: Yes    Comment: monagomous  Other Topics Concern   Not on file  Social History Narrative   Not on file   Social Determinants of Health   Financial Resource Strain: Low Risk  (09/21/2022)   Overall Financial Resource Strain (CARDIA)    Difficulty of Paying Living Expenses: Not hard at all  Food Insecurity: No Food Insecurity (09/21/2022)   Hunger Vital Sign    Worried About Running Out of Food in the Last Year: Never true    Ran Out of Food in the Last Year: Never true  Transportation Needs: No Transportation Needs (09/21/2022)   PRAPARE - Administrator, Civil Service (Medical): No    Lack of Transportation (Non-Medical): No  Physical Activity: Sufficiently Active (  09/21/2022)   Exercise Vital Sign    Days of Exercise per Week: 5 days    Minutes of Exercise per Session: 60 min  Stress: No Stress Concern Present (09/21/2022)   [employer] of Occupational Health - Occupational Stress Questionnaire    Feeling of Stress : Not at all  Social Connections: Moderately Integrated (09/21/2022)   Social Connection and Isolation Panel [NHANES]    Frequency of Communication with Friends and Family: More than three times a week    Frequency of Social Gatherings with Friends and Family: Three times a week    Attends Religious Services: 1 to 4 times per year    Active Member of Clubs or  Organizations: No    Attends Banker Meetings: Never    Marital Status: Married    Tobacco Counseling Counseling given: Not Answered   Clinical Intake:  Pre-visit preparation completed: Yes  Pain : No/denies pain     Diabetes: No  How often do you need to have someone help you when you read instructions, pamphlets, or other written materials from your doctor or pharmacy?: 1 - Never  Interpreter Needed?: No  Information entered by :: Kandis Fantasia LPN   Activities of Daily Living    09/21/2022    9:45 AM  In your present state of health, do you have any difficulty performing the following activities:  Hearing? 0  Vision? 0  Difficulty concentrating or making decisions? 0  Walking or climbing stairs? 0  Dressing or bathing? 0  Doing errands, shopping? 0  Preparing Food and eating ? N  Using the Toilet? N  In the past six months, have you accidently leaked urine? N  Do you have problems with loss of bowel control? N  Managing your Medications? N  Managing your Finances? N  Housekeeping or managing your Housekeeping? N    Patient Care Team: Caro Laroche, DO as PCP - General (Family Medicine) Glenford Peers, OD as Referring Physician (Optometry) Wynona Canes, MD as Referring Physician (Dermatology)  Indicate any recent Medical Services you may have received from other than [facility] providers in the past year (date may be approximate).     Assessment:   This is a routine wellness examination for Isaac Crane.  Hearing/Vision screen Hearing Screening - Comments:: Denies hearing difficulties   Vision Screening - Comments:: Wears rx glasses - up to date with routine eye exams with Dr. Harriette Bouillon   Dietary issues and exercise activities discussed:     Goals Addressed             This Visit's Progress    Remain active and independent        Depression Screen    09/21/2022    9:47 AM 07/11/2022   11:44 AM 07/06/2021    8:30 AM 11/03/2020    11:56 AM 07/04/2020    9:37 AM 02/17/2018    8:31 AM 03/06/2017    1:51 PM  PHQ 2/9 Scores  PHQ - 2 Score 0 0 0 0 0 0 0  PHQ- 9 Score  0 0 0 0      Fall Risk    09/21/2022    4:31 PM 07/11/2022   10:49 AM 07/04/2020    8:36 AM 02/17/2018    8:31 AM 03/06/2017    1:51 PM  Fall Risk   Falls in the past year? 0 0 0 0 No  Number falls in past yr: 0  0    Injury with Fall?  0      Risk for fall due to : No Fall Risks      Follow up Falls prevention discussed;Education provided;Falls evaluation completed  Falls evaluation completed      MEDICARE RISK AT HOME:  Medicare Risk at Home - 09/21/22 1631     Any stairs in or around the home? No    If so, are there any without handrails? No    Home free of loose throw rugs in walkways, pet beds, electrical cords, etc? Yes    Adequate lighting in your home to reduce risk of falls? Yes    Life alert? No    Use of a cane, walker or w/c? No    Grab bars in the bathroom? Yes    Shower chair or bench in shower? No    Elevated toilet seat or a handicapped toilet? No             TIMED UP AND GO:  Was the test performed? No    Cognitive Function:        09/21/2022    4:32 PM  6CIT Screen  What Year? 0 points  What month? 0 points  What time? 0 points  Count back from 20 0 points  Months in reverse 0 points  Repeat phrase 0 points  Total Score 0 points    Immunizations Immunization History  Administered Date(s) Administered   Fluad Quad(high Dose 65+) 11/26/2018, 11/03/2020   Influenza Whole 11/18/2006, 12/31/2008   Influenza, High Dose Seasonal PF 11/08/2017   Influenza-Unspecified 03/11/2013, 11/11/2016, 12/27/2017   PFIZER Comirnaty(Gray Top)Covid-19 Tri-Sucrose Vaccine 07/04/2020   PFIZER(Purple Top)SARS-COV-2 Vaccination 03/06/2019, 03/27/2019, 11/28/2019   Pfizer Covid-19 Vaccine Bivalent Booster 50yrs & up 12/06/2020   Pneumococcal Conjugate-13 04/09/2014   Pneumococcal Polysaccharide-23 06/06/2012   Td 11/13/1994,  11/18/2006   Tdap 03/06/2017   Unspecified SARS-COV-2 Vaccination 11/10/2021   Zoster Recombinant(Shingrix) 03/24/2018, 09/14/2018   Zoster, Live 01/26/2009    TDAP status: Up to date  Flu Vaccine status: Due, Education has been provided regarding the importance of this vaccine. Advised may receive this vaccine at local pharmacy or Health Dept. Aware to provide a copy of the vaccination record if obtained from local pharmacy or Health Dept. Verbalized acceptance and understanding.  Pneumococcal vaccine status: Up to date  Covid-19 vaccine status: Information provided on how to obtain vaccines.   Qualifies for Shingles Vaccine? Yes   Zostavax completed No   Shingrix Completed?: Yes  Screening Tests Health Maintenance  Topic Date Due   COVID-19 Vaccine (7 - 2023-24 season) 01/05/2022   INFLUENZA VACCINE  09/13/2022   Medicare Annual Wellness (AWV)  09/21/2023   DTaP/Tdap/Td (4 - Td or Tdap) 03/07/2027   Pneumonia Vaccine 9+ Years old  Completed   Hepatitis C Screening  Completed   Zoster Vaccines- Shingrix  Completed   HPV VACCINES  Aged Out   Fecal DNA (Cologuard)  Discontinued    Health Maintenance  Health Maintenance Due  Topic Date Due   COVID-19 Vaccine (7 - 2023-24 season) 01/05/2022   INFLUENZA VACCINE  09/13/2022    Colorectal cancer screening: No longer required.   Lung Cancer Screening: (Low Dose CT Chest recommended if Age 48-80 years, 20 pack-year currently smoking OR have quit w/in 15years.) does not qualify.   Lung Cancer Screening Referral: n/a  Additional Screening:  Hepatitis C Screening: does qualify; Completed 03/06/17  Vision Screening: Recommended annual ophthalmology exams for early detection of glaucoma and other disorders of the eye.  Is the patient up to date with their annual eye exam?  Yes  Who is the provider or what is the name of the office in which the patient attends annual eye exams? Dr. Harriette Bouillon If pt is not established with a  provider, would they like to be referred to a provider to establish care? No .   Dental Screening: Recommended annual dental exams for proper oral hygiene  Community Resource Referral / Chronic Care Management: CRR required this visit?  No   CCM required this visit?  No    Plan:     I have personally reviewed and noted the following in the patient's chart:   Medical and social history Use of alcohol, tobacco or illicit drugs  Current medications and supplements including opioid prescriptions. Patient is not currently taking opioid prescriptions. Functional ability and status Nutritional status Physical activity Advanced directives List of other physicians Hospitalizations, surgeries, and ER visits in previous 12 months Vitals Screenings to include cognitive, depression, and falls Referrals and appointments  In addition, I have reviewed and discussed with patient certain preventive protocols, quality metrics, and best practice recommendations. A written personalized care plan for preventive services as well as general preventive health recommendations were provided to patient.     Kandis Fantasia [place], [place]   4/[id number]/9811   After Visit Summary: (MyChart) Due to this being a telephonic visit, the after visit summary with patients personalized plan was offered to patient via MyChart   Nurse Notes: No concerns at this time

## 2022-11-14 DIAGNOSIS — L578 Other skin changes due to chronic exposure to nonionizing radiation: Secondary | ICD-10-CM | POA: Diagnosis not present

## 2022-11-14 DIAGNOSIS — D225 Melanocytic nevi of trunk: Secondary | ICD-10-CM | POA: Diagnosis not present

## 2022-11-14 DIAGNOSIS — L814 Other melanin hyperpigmentation: Secondary | ICD-10-CM | POA: Diagnosis not present

## 2022-11-14 DIAGNOSIS — L821 Other seborrheic keratosis: Secondary | ICD-10-CM | POA: Diagnosis not present

## 2022-12-24 DIAGNOSIS — L570 Actinic keratosis: Secondary | ICD-10-CM | POA: Diagnosis not present

## 2023-01-18 ENCOUNTER — Other Ambulatory Visit (HOSPITAL_BASED_OUTPATIENT_CLINIC_OR_DEPARTMENT_OTHER): Payer: Self-pay

## 2023-01-18 MED ORDER — COVID-19 MRNA VAC-TRIS(PFIZER) 30 MCG/0.3ML IM SUSY
0.3000 mL | PREFILLED_SYRINGE | Freq: Once | INTRAMUSCULAR | 0 refills | Status: AC
  Filled 2023-01-18: qty 0.3, 1d supply, fill #0

## 2023-02-26 ENCOUNTER — Other Ambulatory Visit: Payer: Self-pay | Admitting: Family Medicine

## 2023-02-26 DIAGNOSIS — L570 Actinic keratosis: Secondary | ICD-10-CM | POA: Diagnosis not present

## 2023-02-26 DIAGNOSIS — M19072 Primary osteoarthritis, left ankle and foot: Secondary | ICD-10-CM

## 2023-03-01 ENCOUNTER — Other Ambulatory Visit (HOSPITAL_BASED_OUTPATIENT_CLINIC_OR_DEPARTMENT_OTHER): Payer: Self-pay

## 2023-03-01 MED ORDER — CAPVAXIVE 0.5 ML IM SOSY
0.5000 mL | PREFILLED_SYRINGE | Freq: Once | INTRAMUSCULAR | 0 refills | Status: AC
  Filled 2023-03-01: qty 0.5, 1d supply, fill #0

## 2023-05-24 DIAGNOSIS — H25013 Cortical age-related cataract, bilateral: Secondary | ICD-10-CM | POA: Diagnosis not present

## 2023-05-24 DIAGNOSIS — H25043 Posterior subcapsular polar age-related cataract, bilateral: Secondary | ICD-10-CM | POA: Diagnosis not present

## 2023-05-24 DIAGNOSIS — H18413 Arcus senilis, bilateral: Secondary | ICD-10-CM | POA: Diagnosis not present

## 2023-05-24 DIAGNOSIS — H2511 Age-related nuclear cataract, right eye: Secondary | ICD-10-CM | POA: Diagnosis not present

## 2023-05-24 DIAGNOSIS — H2513 Age-related nuclear cataract, bilateral: Secondary | ICD-10-CM | POA: Diagnosis not present

## 2023-05-25 ENCOUNTER — Other Ambulatory Visit: Payer: Self-pay | Admitting: Family Medicine

## 2023-05-25 DIAGNOSIS — E7800 Pure hypercholesterolemia, unspecified: Secondary | ICD-10-CM

## 2023-05-30 IMAGING — US US ABDOMEN LIMITED
1 series · 14 of 25 positions shown · non-contrast
Comparison: None.

CLINICAL DATA: Asymptomatic transaminitis.

EXAM:
ULTRASOUND ABDOMEN LIMITED RIGHT UPPER QUADRANT

[Series 1: us abdomen limited · 0.19mm/px · 14 of 36 slices shown]
[im 1/36]
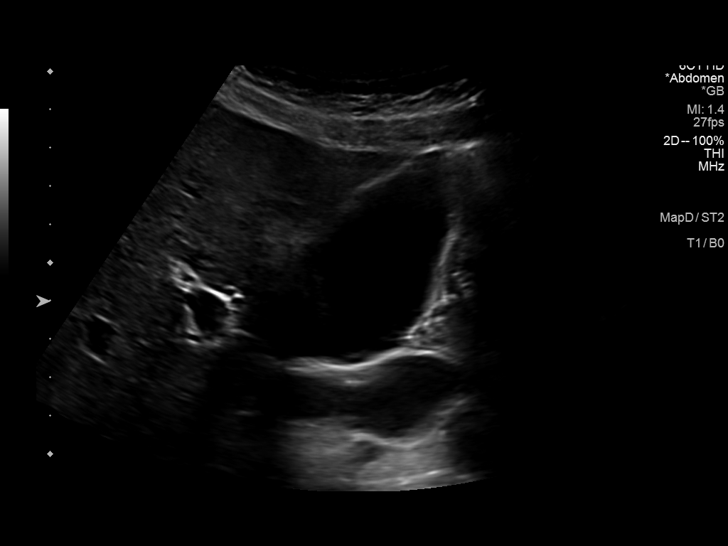
[im 3/36]
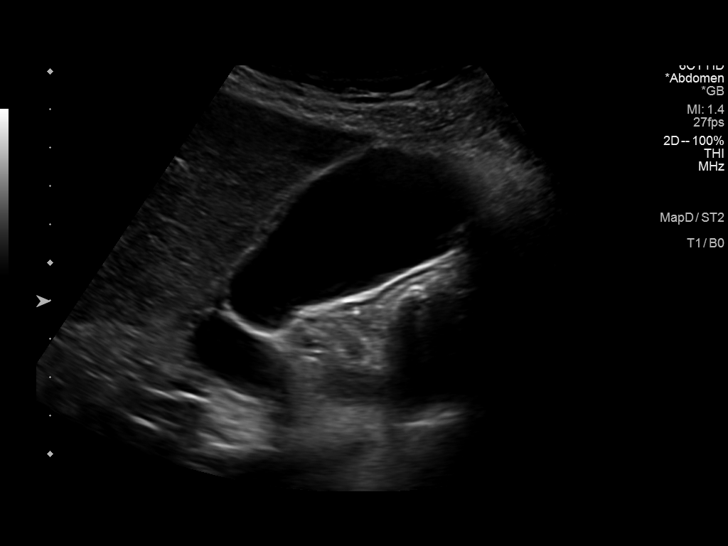
[im 6/36]
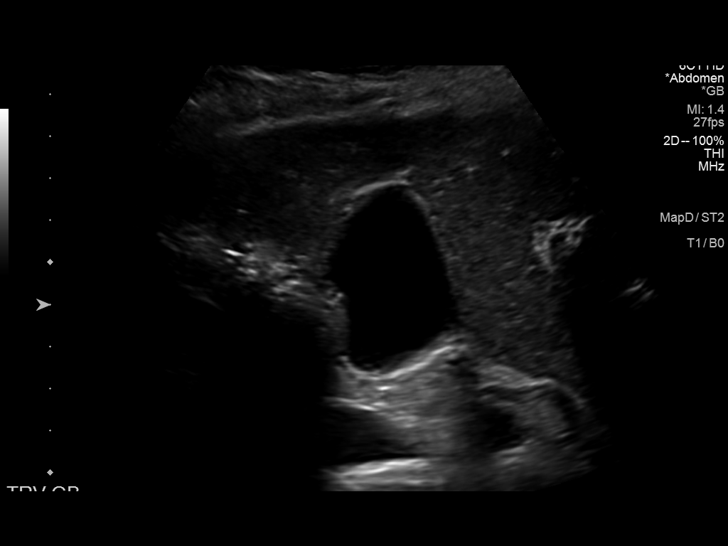
[im 9/36]
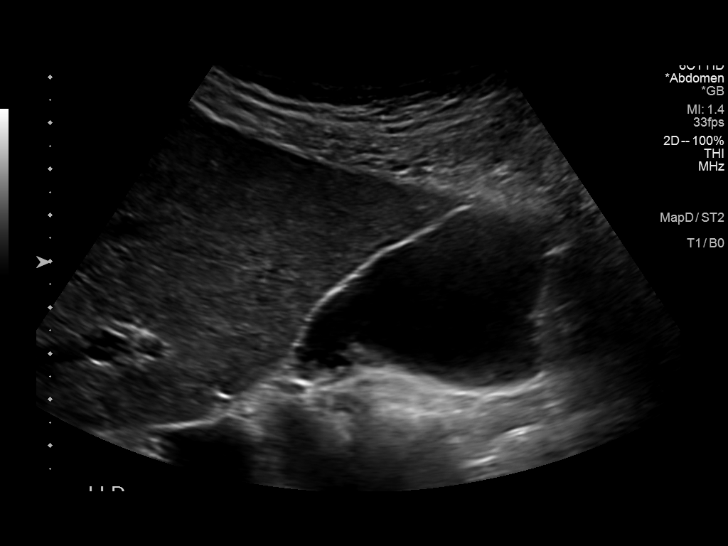
[im 12/36]
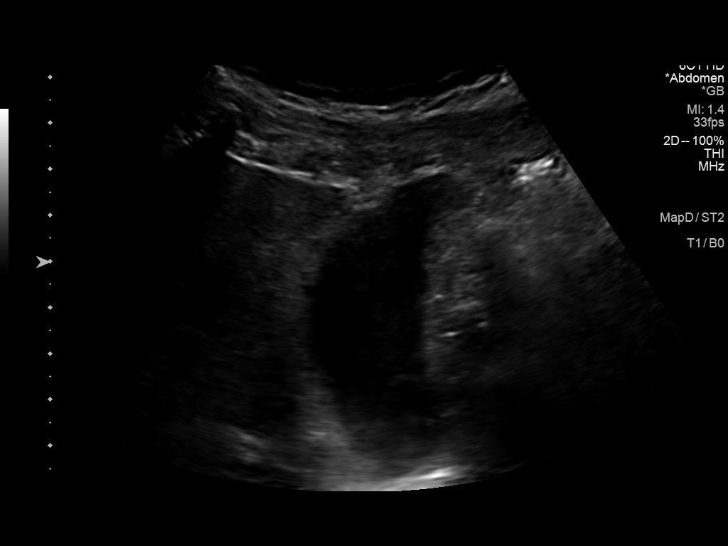
[im 14/36]
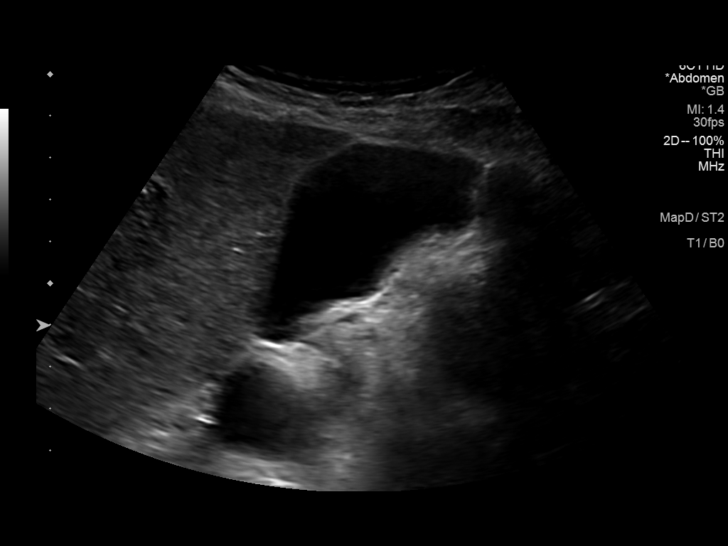
[im 17/36]
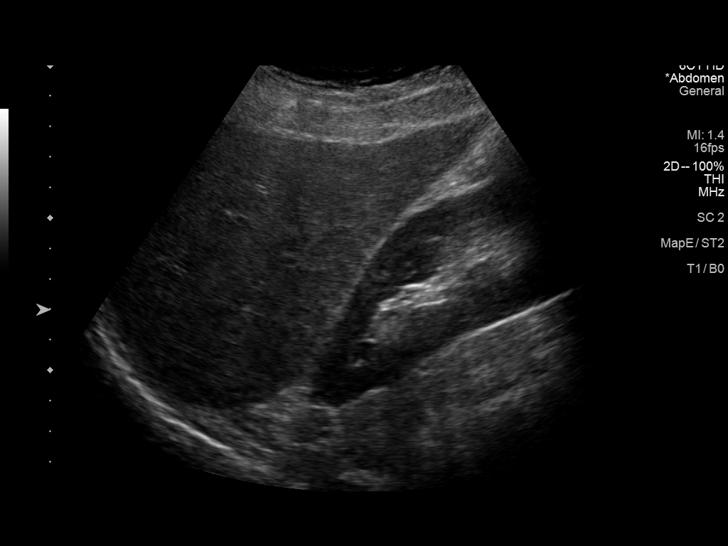
[im 19/36]
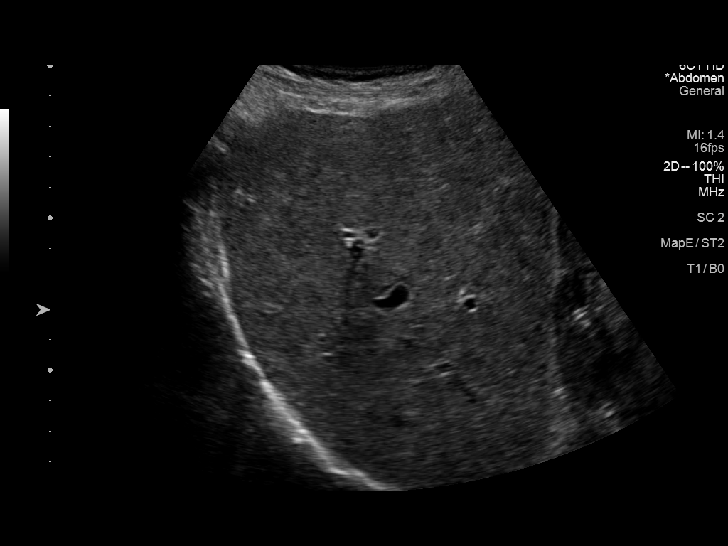
[im 22/36]
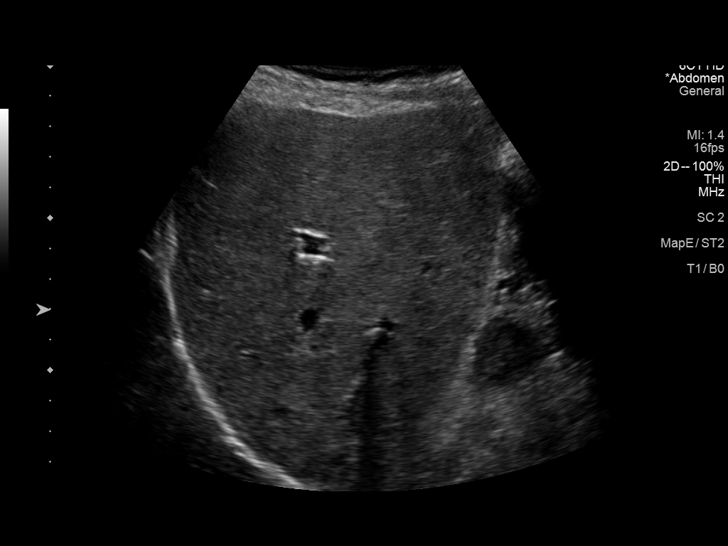
[im 24/36]
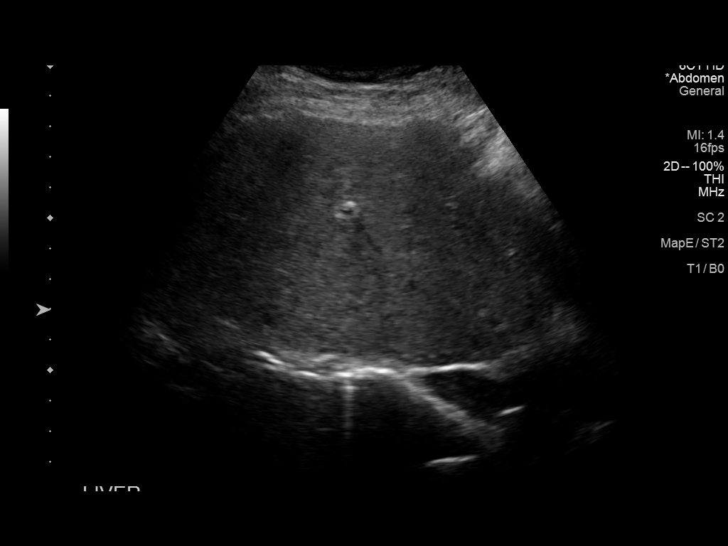
[im 27/36]
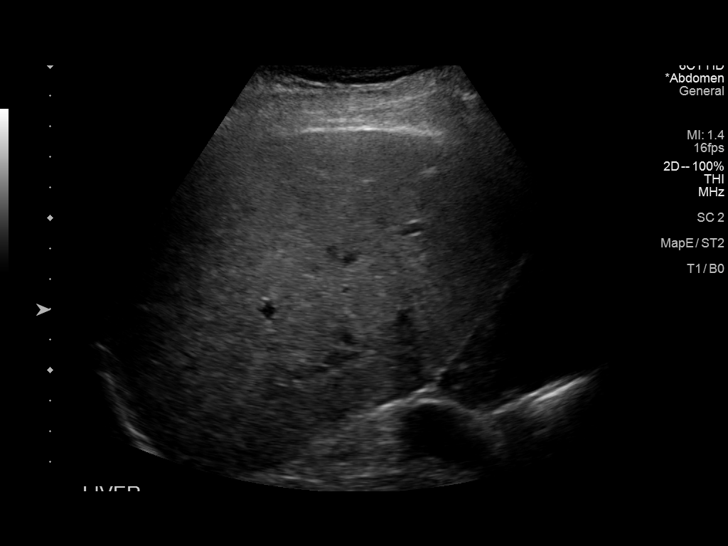
[im 30/36]
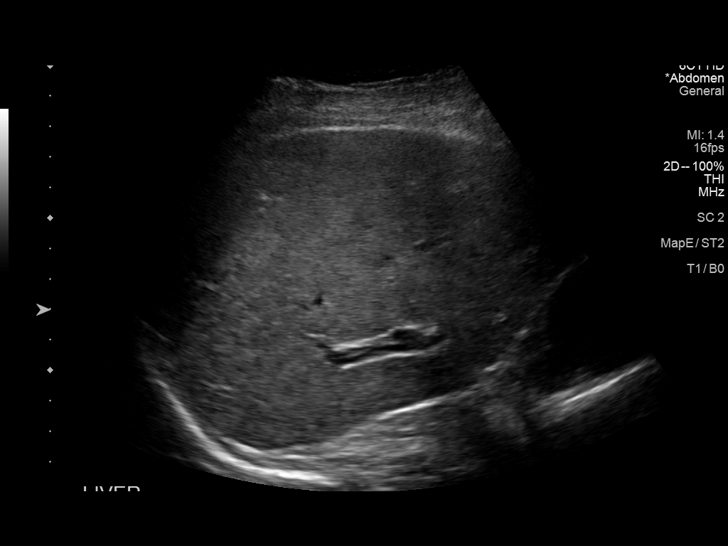
[im 33/36]
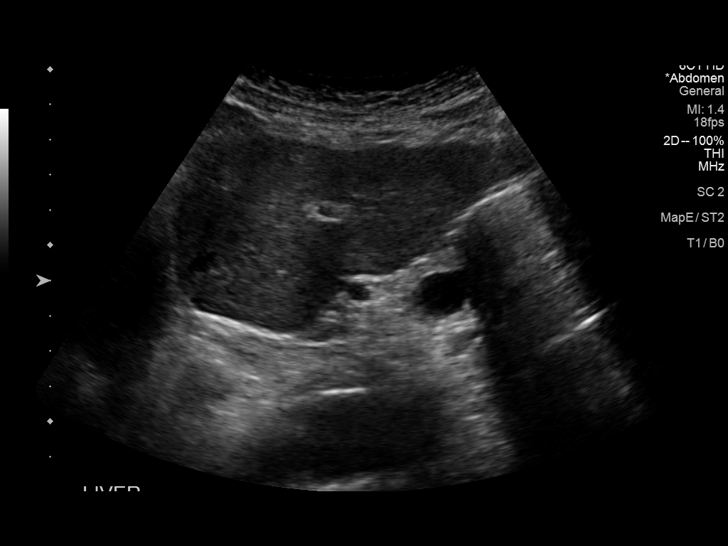
[im 36/36]
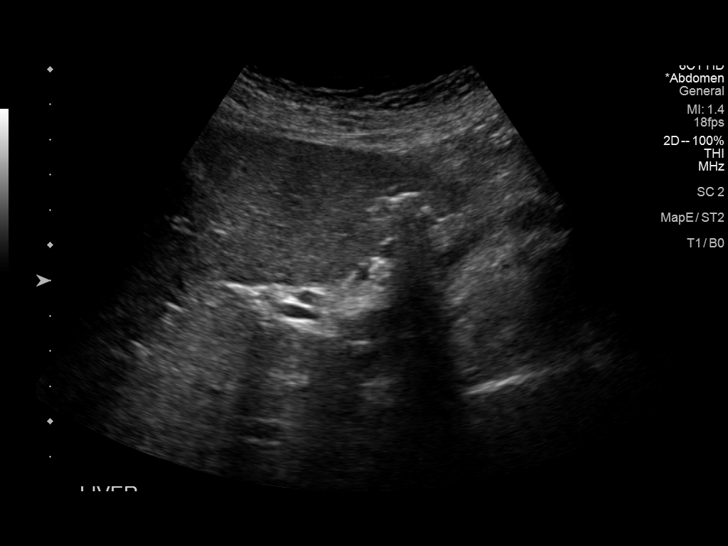

[14 of 25 positions shown; findings below may reference images not displayed]

FINDINGS: Gallbladder:

No gallstones or wall thickening visualized. No sonographic Murphy
sign noted by sonographer.

Common bile duct:

Diameter: 3 mm

Liver:

No focal lesion identified. Coarsened hepatic echotexture without
discrete contour nodularity. Portal vein is patent on color Doppler
imaging with normal direction of blood flow towards the liver.

Other: None.
IMPRESSION: Coarsened hepatic echotexture, nonspecific, but can be seen in the
setting of hepatic steatosis or hepatocellular disease. Consider
further evaluation with ultrasound elastography. No focal liver
lesion identified.

## 2023-05-31 DIAGNOSIS — H2512 Age-related nuclear cataract, left eye: Secondary | ICD-10-CM | POA: Diagnosis not present

## 2023-05-31 DIAGNOSIS — H2511 Age-related nuclear cataract, right eye: Secondary | ICD-10-CM | POA: Diagnosis not present

## 2023-06-17 DIAGNOSIS — D492 Neoplasm of unspecified behavior of bone, soft tissue, and skin: Secondary | ICD-10-CM | POA: Diagnosis not present

## 2023-06-17 DIAGNOSIS — L570 Actinic keratosis: Secondary | ICD-10-CM | POA: Diagnosis not present

## 2023-06-17 DIAGNOSIS — L820 Inflamed seborrheic keratosis: Secondary | ICD-10-CM | POA: Diagnosis not present

## 2023-06-17 DIAGNOSIS — H25042 Posterior subcapsular polar age-related cataract, left eye: Secondary | ICD-10-CM | POA: Diagnosis not present

## 2023-06-17 DIAGNOSIS — L538 Other specified erythematous conditions: Secondary | ICD-10-CM | POA: Diagnosis not present

## 2023-06-17 DIAGNOSIS — H25012 Cortical age-related cataract, left eye: Secondary | ICD-10-CM | POA: Diagnosis not present

## 2023-06-17 DIAGNOSIS — H2512 Age-related nuclear cataract, left eye: Secondary | ICD-10-CM | POA: Diagnosis not present

## 2023-07-22 ENCOUNTER — Ambulatory Visit (INDEPENDENT_AMBULATORY_CARE_PROVIDER_SITE_OTHER): Admitting: Family Medicine

## 2023-07-22 ENCOUNTER — Encounter: Payer: Self-pay | Admitting: Family Medicine

## 2023-07-22 VITALS — BP 138/82 | HR 89 | Ht 73.0 in | Wt 185.0 lb

## 2023-07-22 DIAGNOSIS — Z125 Encounter for screening for malignant neoplasm of prostate: Secondary | ICD-10-CM | POA: Diagnosis not present

## 2023-07-22 DIAGNOSIS — R7401 Elevation of levels of liver transaminase levels: Secondary | ICD-10-CM | POA: Diagnosis not present

## 2023-07-22 DIAGNOSIS — E7800 Pure hypercholesterolemia, unspecified: Secondary | ICD-10-CM

## 2023-07-22 NOTE — Patient Instructions (Signed)
 It was great to see you!  Our plans for today:  - No changes to your medications.    We are checking some labs today, we will release these results to your MyChart.  Take care and seek immediate care sooner if you develop any concerns.   Dr. Alverna John   Preventive Care 13 Years and Older, Male Preventive care refers to lifestyle choices and visits with your health care provider that can promote health and wellness. Preventive care visits are also called wellness exams. What can I expect for my preventive care visit? Counseling During your preventive care visit, your health care provider may ask about your: Medical history, including: Past medical problems. Family medical history. History of falls. Current health, including: Emotional well-being. Home life and relationship well-being. Sexual activity. Memory and ability to understand (cognition). Lifestyle, including: Alcohol, nicotine or tobacco, and drug use. Access to firearms. Diet, exercise, and sleep habits. Work and work Astronomer. Sunscreen use. Safety issues such as seatbelt and bike helmet use. Physical exam Your health care provider will check your: Height and weight. These may be used to calculate your BMI (body mass index). BMI is a measurement that tells if you are at a healthy weight. Waist circumference. This measures the distance around your waistline. This measurement also tells if you are at a healthy weight and may help predict your risk of certain diseases, such as type 2 diabetes and high blood pressure. Heart rate and blood pressure. Body temperature. Skin for abnormal spots. What immunizations do I need?  Vaccines are usually given at various ages, according to a schedule. Your health care provider will recommend vaccines for you based on your age, medical history, and lifestyle or other factors, such as travel or where you work. What tests do I need? Screening Your health care provider may recommend  screening tests for certain conditions. This may include: Lipid and cholesterol levels. Diabetes screening. This is done by checking your blood sugar (glucose) after you have not eaten for a while (fasting). Hepatitis C test. Hepatitis B test. HIV (human immunodeficiency virus) test. STI (sexually transmitted infection) testing, if you are at risk. Lung cancer screening. Colorectal cancer screening. Prostate cancer screening. Abdominal aortic aneurysm (AAA) screening. You may need this if you are a current or former smoker. Talk with your health care provider about your test results, treatment options, and if necessary, the need for more tests. Follow these instructions at home: Eating and drinking  Eat a diet that includes fresh fruits and vegetables, whole grains, lean protein, and low-fat dairy products. Limit your intake of foods with high amounts of sugar, saturated fats, and salt. Take vitamin and mineral supplements as recommended by your health care provider. Do not drink alcohol if your health care provider tells you not to drink. If you drink alcohol: Limit how much you have to 0-2 drinks a day. Know how much alcohol is in your drink. In the [place] one drink equals one 12 oz bottle of beer (355 mL), one 5 oz glass of wine (148 mL), or one 1 oz glass of hard liquor (44 mL). Lifestyle Brush your teeth every morning and night with fluoride toothpaste. Floss one time each day. Exercise for at least 30 minutes 5 or more days each week. Do not use any products that contain nicotine or tobacco. These products include cigarettes, chewing tobacco, and vaping devices, such as e-cigarettes. If you need help quitting, ask your health care provider. Do not use drugs. If you  are sexually active, practice safe sex. Use a condom or other form of protection to prevent STIs. Take aspirin only as told by your health care provider. Make sure that you understand how much to take and what form to  take. Work with your health care provider to find out whether it is safe and beneficial for you to take aspirin daily. Ask your health care provider if you need to take a cholesterol-lowering medicine (statin). Find healthy ways to manage stress, such as: Meditation, yoga, or listening to music. Journaling. Talking to a trusted person. Spending time with friends and family. Safety Always wear your seat belt while driving or riding in a vehicle. Do not drive: If you have been drinking alcohol. Do not ride with someone who has been drinking. When you are tired or distracted. While texting. If you have been using any mind-altering substances or drugs. Wear a helmet and other protective equipment during sports activities. If you have firearms in your house, make sure you follow all gun safety procedures. Minimize exposure to UV radiation to reduce your risk of skin cancer. What's next? Visit your health care provider once a year for an annual wellness visit. Ask your health care provider how often you should have your eyes and teeth checked. Stay up to date on all vaccines. This information is not intended to replace advice given to you by your health care provider. Make sure you discuss any questions you have with your health care provider. Document Revised: 07/27/2020 Document Reviewed: 07/27/2020 Elsevier Patient Education  2024 [employer].

## 2023-07-22 NOTE — Assessment & Plan Note (Signed)
 Check lipid panel

## 2023-07-22 NOTE — Progress Notes (Signed)
 BP 138/82   Pulse 89   Ht 6\' 1"  (1.854 m)   Wt 185 lb (83.9 kg)   SpO2 97%   BMI 24.41 kg/m    Subjective:    Patient ID: Isaac Crane, male    DOB: [birth year], 78 y.o.   MRN: [id number]  HPI: Isaac Crane is a 78 y.o. male presenting on 07/22/2023 for comprehensive medical examination. Current medical complaints include:none  Diet - salad for lunch. Eats Mediterranean diet. 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 Takes pamelor , helps with pain. Also with h/o bunions, ankle arthritis.  Takes xanax  prn for long plane rides to help sleep.  Depression Screen done today and results listed below:     07/22/2023    8:39 AM 09/21/2022    9:47 AM 07/11/2022   11:44 AM 07/06/2021    8:30 AM 11/03/2020   11:56 AM  Depression screen PHQ 2/9  Decreased Interest 0 0 0 0 0  Down, Depressed, Hopeless 0 0 0 0 0  PHQ - 2 Score 0 0 0 0 0  Altered sleeping 0  0 0 0  Tired, decreased energy 0  0 0 0  Change in appetite 0  0 0 0  Feeling bad or failure about yourself  0  0 0 0  Trouble concentrating 0  0 0 0  Moving slowly or fidgety/restless 0  0 0 0  Suicidal thoughts 0  0 0 0  PHQ-9 Score 0  0 0 0  Difficult doing work/chores   Not difficult at all      The patient does not have a history of falls.  Past medical history, surgical history, medications, allergies, family history and social history reviewed with patient today and changes made to appropriate areas of the chart.      Objective:     BP 138/82   Pulse 89   Ht 6\' 1"  (1.854 m)   Wt 185 lb (83.9 kg)   SpO2 97%   BMI 24.41 kg/m   Wt Readings from Last 3 Encounters:  07/22/23 185 lb (83.9 kg)  09/21/22 184 lb (83.5 kg)  07/11/22 184 lb 12.8 oz (83.8 kg)    Physical Exam Constitutional:      General: He is not in acute distress.     Appearance: Normal appearance. He is not toxic-appearing.  HENT:     Head: Normocephalic and atraumatic.     Right Ear: Tympanic membrane, ear canal and external ear normal.     Left Ear: Tympanic membrane, ear canal and external ear normal.     Nose: Nose normal.     Mouth/Throat:     Mouth: Mucous membranes are moist.     Pharynx: Oropharynx is clear.  Eyes:     Pupils: Pupils are equal, round, and reactive to light.  Cardiovascular:     Rate and Rhythm: Normal rate and regular rhythm.     Heart sounds: Normal heart sounds. No murmur heard. Pulmonary:     Effort: Pulmonary effort is normal. No respiratory distress.     Breath sounds: Normal breath sounds.  Abdominal:     General: Bowel sounds are normal.     Palpations: Abdomen is soft.     Tenderness: There is no abdominal tenderness.  Musculoskeletal:        General: Normal  range of motion.     Cervical back: Normal range of motion.     Right lower leg: No edema.     Left lower leg: No edema.  Lymphadenopathy:     Cervical: No cervical adenopathy.  Skin:    General: Skin is warm and dry.  Neurological:     Mental Status: He is alert and oriented to person, place, and time. Mental status is at baseline.     Gait: Gait normal.  Psychiatric:        Mood and Affect: Mood normal.        Behavior: Behavior normal.     Results for orders placed or performed in visit on 07/11/22  Comprehensive metabolic panel   Collection Time: 07/11/22  2:13 PM  Result Value Ref Range   Glucose 97 70 - 99 mg/dL   BUN 14 8 - 27 mg/dL   Creatinine, Ser 1.61 0.76 - 1.27 mg/dL   eGFR 80 >09 UE/AVW/0.98   BUN/Creatinine Ratio 14 10 - 24   Sodium 138 134 - 144 mmol/L   Potassium 4.5 3.5 - 5.2 mmol/L   Chloride 102 96 - 106 mmol/L   CO2 21 20 - 29 mmol/L   Calcium  9.7 8.6 - 10.2 mg/dL   Total Protein 7.2 6.0 - 8.5 g/dL   Albumin 4.7 3.8 - 4.8 g/dL   Globulin, Total 2.5 1.5 - 4.5 g/dL   Albumin/Globulin Ratio 1.9 1.2 - 2.2   Bilirubin  Total 0.5 0.0 - 1.2 mg/dL   Alkaline Phosphatase 101 44 - 121 IU/L   AST 38 0 - 40 IU/L   ALT 52 (H) 0 - 44 IU/L  Lipid panel   Collection Time: 07/11/22  2:13 PM  Result Value Ref Range   Cholesterol, Total 154 100 - 199 mg/dL   Triglycerides 54 0 - 149 mg/dL   HDL 74 >11 mg/dL   VLDL Cholesterol Cal 11 5 - 40 mg/dL   LDL Chol Calc (NIH) 69 0 - 99 mg/dL   Chol/HDL Ratio 2.1 0.0 - 5.0 ratio      Assessment & Plan:   Problem List Items Addressed This Visit       Other   HYPERCHOLESTEROLEMIA, PURE - Primary   Check lipid panel.      Relevant Orders   Comprehensive metabolic panel with GFR   Transaminitis   Relevant Orders   Comprehensive metabolic panel with GFR   Other Visit Diagnoses       Screening for malignant neoplasm of prostate       Relevant Orders   PSA        LABORATORY TESTING:  Health maintenance labs ordered today as discussed above.   The natural history of prostate cancer and ongoing controversy regarding screening and potential treatment outcomes of prostate cancer has been discussed with the patient. The meaning of a false positive PSA and a false negative PSA has been discussed. He indicates understanding of the limitations of this screening test and wishes to proceed with screening PSA testing.   IMMUNIZATIONS:   - Tdap: Tetanus vaccination status reviewed: last tetanus booster within 10 years. - Influenza: Postponed to flu season - Pneumococcal: Up to date - HPV: Not applicable - Shingrix vaccine: Up to date - COVID vaccine: UTD  SCREENING: - Colonoscopy: Not applicable  Discussed with patient purpose of the colonoscopy is to detect colon cancer at curable precancerous or early stages   - AAA Screening: Not applicable  - Lung cancer  screening: n/a  Hep C Screening: UTD Incontinence Symptoms: none  PATIENT COUNSELING:    Advised to avoid cigarette smoking.  I discussed with the patient that most people either abstain from alcohol  or drink within safe limits (<=14/week and <=4 drinks/occasion for males, <=7/weeks and <= 3 drinks/occasion for females) and that the risk for alcohol disorders and other health effects rises proportionally with the number of drinks per week and how often a drinker exceeds daily limits.  Discussed cessation/primary prevention of drug use and availability of treatment for abuse.   Diet: Encouraged to adjust caloric intake to maintain  or achieve ideal body weight, to reduce intake of dietary saturated fat and total fat, to limit sodium intake by avoiding high sodium foods and not adding table salt, and to maintain adequate dietary potassium and calcium  preferably from fresh fruits, vegetables, and low-fat dairy products.    stressed the importance of regular exercise  Injury prevention: Discussed safety belts, safety helmets, smoke detector, smoking near bedding or upholstery.   Dental health: Discussed importance of regular tooth brushing, flossing, and dental visits.   Follow up plan: NEXT PREVENTATIVE PHYSICAL DUE IN 1 YEAR. Return in about 1 year (around 07/21/2024) for CPE.

## 2023-07-23 ENCOUNTER — Ambulatory Visit: Payer: Self-pay | Admitting: Family Medicine

## 2023-07-23 DIAGNOSIS — R7401 Elevation of levels of liver transaminase levels: Secondary | ICD-10-CM

## 2023-07-23 LAB — COMPREHENSIVE METABOLIC PANEL WITH GFR
ALT: 46 IU/L — ABNORMAL HIGH (ref 0–44)
AST: 39 IU/L (ref 0–40)
Albumin: 4.7 g/dL (ref 3.8–4.8)
Alkaline Phosphatase: 95 IU/L (ref 44–121)
BUN/Creatinine Ratio: 17 (ref 10–24)
BUN: 13 mg/dL (ref 8–27)
Bilirubin Total: 0.5 mg/dL (ref 0.0–1.2)
CO2: 21 mmol/L (ref 20–29)
Calcium: 9.5 mg/dL (ref 8.6–10.2)
Chloride: 99 mmol/L (ref 96–106)
Creatinine, Ser: 0.77 mg/dL (ref 0.76–1.27)
Globulin, Total: 2.5 g/dL (ref 1.5–4.5)
Glucose: 94 mg/dL (ref 70–99)
Potassium: 4.8 mmol/L (ref 3.5–5.2)
Sodium: 135 mmol/L (ref 134–144)
Total Protein: 7.2 g/dL (ref 6.0–8.5)
eGFR: 92 mL/min/{1.73_m2} (ref >59)

## 2023-07-23 LAB — PSA: Prostate Specific Ag, Serum: 1.2 ng/mL (ref 0.0–4.0)

## 2023-07-26 ENCOUNTER — Other Ambulatory Visit

## 2023-07-26 DIAGNOSIS — R7401 Elevation of levels of liver transaminase levels: Secondary | ICD-10-CM

## 2023-07-29 ENCOUNTER — Encounter: Payer: Self-pay | Admitting: Family Medicine

## 2023-07-30 ENCOUNTER — Other Ambulatory Visit

## 2023-07-30 DIAGNOSIS — R7401 Elevation of levels of liver transaminase levels: Secondary | ICD-10-CM | POA: Diagnosis not present

## 2023-07-30 NOTE — Addendum Note (Signed)
 Addended by: Angelita Kendall on: 07/30/2023 09:02 AM   Modules accepted: Orders

## 2023-07-31 LAB — IRON,TIBC AND FERRITIN PANEL
Ferritin: 60 ng/mL (ref 30–400)
Iron Saturation: 31 % (ref 15–55)
Iron: 93 ug/dL (ref 38–169)
Total Iron Binding Capacity: 303 ug/dL (ref 250–450)
UIBC: 210 ug/dL (ref 111–343)

## 2023-07-31 LAB — HEPATITIS B CORE AB, IGM: Hep B C IgM: NEGATIVE

## 2023-07-31 LAB — HEPATITIS C ANTIBODY: Hep C Virus Ab: NONREACTIVE

## 2023-07-31 LAB — HEPATITIS B SURFACE ANTIBODY,QUALITATIVE: Hep B Surface Ab, Qual: NONREACTIVE

## 2023-07-31 LAB — HEPATITIS B SURFACE ANTIGEN: Hepatitis B Surface Ag: NEGATIVE

## 2023-08-01 ENCOUNTER — Ambulatory Visit: Payer: Self-pay | Admitting: Family Medicine

## 2023-08-27 DIAGNOSIS — L570 Actinic keratosis: Secondary | ICD-10-CM | POA: Diagnosis not present

## 2023-08-27 DIAGNOSIS — D225 Melanocytic nevi of trunk: Secondary | ICD-10-CM | POA: Diagnosis not present

## 2023-08-27 DIAGNOSIS — L820 Inflamed seborrheic keratosis: Secondary | ICD-10-CM | POA: Diagnosis not present

## 2023-08-27 DIAGNOSIS — L821 Other seborrheic keratosis: Secondary | ICD-10-CM | POA: Diagnosis not present

## 2023-08-27 DIAGNOSIS — L814 Other melanin hyperpigmentation: Secondary | ICD-10-CM | POA: Diagnosis not present

## 2023-08-27 DIAGNOSIS — D492 Neoplasm of unspecified behavior of bone, soft tissue, and skin: Secondary | ICD-10-CM | POA: Diagnosis not present

## 2023-08-29 ENCOUNTER — Other Ambulatory Visit: Payer: Self-pay | Admitting: Family Medicine

## 2023-08-29 DIAGNOSIS — M19072 Primary osteoarthritis, left ankle and foot: Secondary | ICD-10-CM

## 2023-09-06 ENCOUNTER — Ambulatory Visit: Admitting: Family Medicine

## 2023-09-06 ENCOUNTER — Encounter: Payer: Self-pay | Admitting: Family Medicine

## 2023-09-06 VITALS — BP 127/83 | HR 54 | Ht 73.0 in | Wt 192.4 lb

## 2023-09-06 DIAGNOSIS — R7401 Elevation of levels of liver transaminase levels: Secondary | ICD-10-CM | POA: Diagnosis not present

## 2023-09-06 NOTE — Patient Instructions (Addendum)
 It was wonderful to see you today.  Please bring ALL of your medications with you to every visit.   Today we talked about:  We are taking your lab work today. I will call you about these results.   Thank you for choosing [facility].   Please call [phone] with any questions about today's appointment.  Please arrive at least 15 minutes prior to your scheduled appointments.   If you had blood work today, I will send you a MyChart message or a letter if results are normal. Otherwise, I will give you a call.   If you had a referral placed, they will call you to set up an appointment. Please give us  a call if you don't hear back in the next 2 weeks.   If you need additional refills before your next appointment, please call your pharmacy first.   You should follow up in our clinic in Return in about 2 months (around 11/07/2023).  Gloriann Ogren, MD Family Medicine

## 2023-09-06 NOTE — Assessment & Plan Note (Signed)
 Will repeat CMP today. As patient recently had a CMP, anticipate ALT may still be elevated. Will have patient schedule 2 month follow up with PCP for repeat labs.

## 2023-09-06 NOTE — Progress Notes (Signed)
    SUBJECTIVE:   CHIEF COMPLAINT / HPI:   Currently taking Lipitor, nortriptyline  and MV. Stopped taking all other supplements as he was instructed by Dr. Rumball. Would like to have his labs drawn today since he is already here. Work up for transaminitis has been otherwise normal. Denies any abdominal pain   PERTINENT  PMH / PSH:    OBJECTIVE:   BP 127/83   Pulse (!) 55   Ht 6' 1 (1.854 m)   Wt 192 lb 6 oz (87.3 kg)   SpO2 100%   BMI 25.38 kg/m   General: A&O, NAD HEENT: No sign of trauma, EOM grossly intact Respiratory: normal WOB GI: non-distended  Extremities: no peripheral edema. Neuro: Normal gait, moves all four extremities appropriately Skin: no lesions/rashes visualized Psych: Appropriate mood and affect   ASSESSMENT/PLAN:   Assessment & Plan Transaminitis Will repeat CMP today. As patient recently had a CMP, anticipate ALT may still be elevated. Will have patient schedule 2 month follow up with PCP for repeat labs.      Gloriann Ogren, MD [facility]

## 2023-09-07 LAB — COMPREHENSIVE METABOLIC PANEL WITH GFR
ALT: 24 IU/L (ref 0–44)
AST: 23 IU/L (ref 0–40)
Albumin: 4.6 g/dL (ref 3.8–4.8)
Alkaline Phosphatase: 81 IU/L (ref 44–121)
BUN/Creatinine Ratio: 19 (ref 10–24)
BUN: 17 mg/dL (ref 8–27)
Bilirubin Total: 0.6 mg/dL (ref 0.0–1.2)
CO2: 19 mmol/L — ABNORMAL LOW (ref 20–29)
Calcium: 9.6 mg/dL (ref 8.6–10.2)
Chloride: 94 mmol/L — ABNORMAL LOW (ref 96–106)
Creatinine, Ser: 0.88 mg/dL (ref 0.76–1.27)
Globulin, Total: 2.1 g/dL (ref 1.5–4.5)
Glucose: 92 mg/dL (ref 70–99)
Potassium: 5.1 mmol/L (ref 3.5–5.2)
Sodium: 130 mmol/L — ABNORMAL LOW (ref 134–144)
Total Protein: 6.7 g/dL (ref 6.0–8.5)
eGFR: 88 mL/min/1.73 (ref >59)

## 2023-09-10 ENCOUNTER — Ambulatory Visit: Payer: Self-pay | Admitting: Family Medicine

## 2023-11-24 DIAGNOSIS — Z791 Long term (current) use of non-steroidal anti-inflammatories (NSAID): Secondary | ICD-10-CM | POA: Diagnosis not present

## 2023-11-24 DIAGNOSIS — N182 Chronic kidney disease, stage 2 (mild): Secondary | ICD-10-CM | POA: Diagnosis not present

## 2023-11-24 DIAGNOSIS — E785 Hyperlipidemia, unspecified: Secondary | ICD-10-CM | POA: Diagnosis not present

## 2023-11-24 DIAGNOSIS — M1990 Unspecified osteoarthritis, unspecified site: Secondary | ICD-10-CM | POA: Diagnosis not present

## 2023-11-24 DIAGNOSIS — H9193 Unspecified hearing loss, bilateral: Secondary | ICD-10-CM | POA: Diagnosis not present

## 2023-11-24 DIAGNOSIS — Z809 Family history of malignant neoplasm, unspecified: Secondary | ICD-10-CM | POA: Diagnosis not present

## 2023-11-24 DIAGNOSIS — Z85828 Personal history of other malignant neoplasm of skin: Secondary | ICD-10-CM | POA: Diagnosis not present

## 2023-11-24 DIAGNOSIS — R030 Elevated blood-pressure reading, without diagnosis of hypertension: Secondary | ICD-10-CM | POA: Diagnosis not present

## 2023-11-27 ENCOUNTER — Other Ambulatory Visit (HOSPITAL_BASED_OUTPATIENT_CLINIC_OR_DEPARTMENT_OTHER): Payer: Self-pay

## 2023-11-27 MED ORDER — COMIRNATY 30 MCG/0.3ML IM SUSY
0.3000 mL | PREFILLED_SYRINGE | Freq: Once | INTRAMUSCULAR | 0 refills | Status: AC
  Filled 2023-11-27: qty 0.3, 1d supply, fill #0

## 2023-11-30 ENCOUNTER — Other Ambulatory Visit: Payer: Self-pay | Admitting: Family Medicine

## 2023-11-30 DIAGNOSIS — M19072 Primary osteoarthritis, left ankle and foot: Secondary | ICD-10-CM

## 2023-12-30 DIAGNOSIS — D225 Melanocytic nevi of trunk: Secondary | ICD-10-CM | POA: Diagnosis not present

## 2023-12-30 DIAGNOSIS — L570 Actinic keratosis: Secondary | ICD-10-CM | POA: Diagnosis not present

## 2023-12-30 DIAGNOSIS — L814 Other melanin hyperpigmentation: Secondary | ICD-10-CM | POA: Diagnosis not present

## 2023-12-30 DIAGNOSIS — L821 Other seborrheic keratosis: Secondary | ICD-10-CM | POA: Diagnosis not present

## 2024-02-24 ENCOUNTER — Ambulatory Visit

## 2024-02-24 VITALS — Ht 73.0 in

## 2024-02-24 DIAGNOSIS — Z0000 Encounter for general adult medical examination without abnormal findings: Secondary | ICD-10-CM

## 2024-02-24 NOTE — Progress Notes (Signed)
 "  Chief Complaint  Patient presents with   Medicare Wellness    SUBSEQUENT      Subjective:   Isaac Crane is a 79 y.o. male who presents for a Medicare Annual Wellness Visit.  Visit info / Clinical Intake: Medicare Wellness Visit Type:: Subsequent Annual Wellness Visit Persons participating in visit and providing information:: patient Medicare Wellness Visit Mode:: Telephone If telephone:: video declined Since this visit was completed virtually, some vitals may be partially provided or unavailable. Missing vitals are due to the limitations of the virtual format.: Unable to obtain vitals - no equipment If Telephone or Video please confirm:: I connected with patient using audio/video enable telemedicine. I verified patient identity with two identifiers, discussed telehealth limitations, and patient agreed to proceed. Patient Location:: HOME Provider Location:: OFFICE Interpreter Needed?: No Pre-visit prep was completed: yes AWV questionnaire completed by patient prior to visit?: yes Date:: 02/24/24 Living arrangements:: lives with spouse/significant other Patient's Overall Health Status Rating: good Typical amount of pain: some Does pain affect daily life?: (!) yes Are you currently prescribed opioids?: no  Dietary Habits and Nutritional Risks How many meals a day?: 3 Eats fruit and vegetables daily?: yes Most meals are obtained by: preparing own meals In the last 2 weeks, have you had any of the following?: none Diabetic:: no  Functional Status Activities of Daily Living (to include ambulation/medication): Independent Ambulation: Independent Medication Administration: Independent Home Management (perform basic housework or laundry): Independent Manage your own finances?: yes Primary transportation is: driving Concerns about vision?: no *vision screening is required for WTM* Concerns about hearing?: no  Fall Screening Falls in the past year?: 0 Number of falls in past  year: 0 Was there an injury with Fall?: 0 Fall Risk Category Calculator: 0 Patient Fall Risk Level: Low Fall Risk  Fall Risk Patient at Risk for Falls Due to: No Fall Risks Fall risk Follow up: Falls evaluation completed; Education provided  Home and Transportation Safety: All rugs have non-skid backing?: yes All stairs or steps have railings?: yes Grab bars in the bathtub or shower?: yes Have non-skid surface in bathtub or shower?: (!) no Good home lighting?: yes Regular seat belt use?: yes Hospital stays in the last year:: no  Cognitive Assessment Difficulty concentrating, remembering, or making decisions? : no Will 6CIT or Mini Cog be Completed: yes What year is it?: 0 points What month is it?: 0 points Give patient an address phrase to remember (5 components): [address] [place] what time is it?: 0 points Count backwards from 20 to 1: 0 points Say the months of the year in reverse: 0 points Repeat the address phrase from earlier: 0 points 6 CIT Score: 0 points  Advance Directives (For Healthcare) Does Patient Have a Medical Advance Directive?: Yes Does patient want to make changes to medical advance directive?: No - Patient declined Type of Advance Directive: Healthcare Power of [place]; Living will Copy of Healthcare Power of Attorney in Chart?: No - copy requested Copy of Living Will in Chart?: No - copy requested Would patient like information on creating a medical advance directive?: No - Patient declined  Reviewed/Updated  Reviewed/Updated: Reviewed All (Medical, Surgical, Family, Medications, Allergies, Care Teams, Patient Goals)    Allergies (verified) Other   Current Medications (verified) Outpatient Encounter Medications as of 02/24/2024  Medication Sig   atorvastatin  (LIPITOR) 40 MG tablet TAKE 1 TABLET BY MOUTH EVERY DAY   nortriptyline  (PAMELOR ) 25 MG capsule TAKE 1 CAPSULE (25 MG  TOTAL) BY MOUTH 2 (TWO) TIMES DAILY. FOR NEUROPATHIC  FOOT PAIN   ALPRAZolam  (XANAX ) 0.5 MG tablet Take 1 tablet (0.5 mg total) by mouth at bedtime as needed. For sleep while traveling.  May repeat a dose after 30 minutes if no effect. (Patient not taking: Reported on 02/24/2024)   No facility-administered encounter medications on file as of 02/24/2024.    History: History reviewed. No pertinent past medical history. History reviewed. No pertinent surgical history. History reviewed. No pertinent family history. Social History   Occupational History   Not on file  Tobacco Use   Smoking status: Never   Smokeless tobacco: Never  Substance and Sexual Activity   Alcohol use: Not Currently    Alcohol/week: 42.0 standard drinks of alcohol    Types: 42 drink(s) per week    Comment: Occasionally   Drug use: No   Sexual activity: Yes    Comment: monagomous   Tobacco Counseling Counseling given: Not Answered  SDOH Screenings   Food Insecurity: No Food Insecurity (02/24/2024)  Housing: Low Risk (02/24/2024)  Transportation Needs: No Transportation Needs (02/24/2024)  Utilities: Not At Risk (02/24/2024)  Alcohol Screen: Low Risk (02/24/2024)  Depression (PHQ2-9): Low Risk (02/24/2024)  Financial Resource Strain: Low Risk (02/24/2024)  Physical Activity: Sufficiently Active (02/24/2024)  Social Connections: Moderately Isolated (02/24/2024)  Stress: No Stress Concern Present (02/24/2024)  Tobacco Use: Low Risk (02/24/2024)  Health Literacy: Adequate Health Literacy (02/24/2024)   See flowsheets for full screening details  Depression Screen PHQ 2 & 9 Depression Scale- Over the past 2 weeks, how often have you been bothered by any of the following problems? Little interest or pleasure in doing things: 0 Feeling down, depressed, or hopeless (PHQ Adolescent also includes...irritable): 0 PHQ-2 Total Score: 0 Trouble falling or staying asleep, or sleeping too much: 0 Feeling tired or having little energy: 0 Poor appetite or overeating (PHQ Adolescent  also includes...weight loss): 0 Feeling bad about yourself - or that you are a failure or have let yourself or your family down: 0 Trouble concentrating on things, such as reading the newspaper or watching television (PHQ Adolescent also includes...like school work): 0 Moving or speaking so slowly that other people could have noticed. Or the opposite - being so fidgety or restless that you have been moving around a lot more than usual: 0 Thoughts that you would be better off dead, or of hurting yourself in some way: 0 PHQ-9 Total Score: 0 If you checked off any problems, how difficult have these problems made it for you to do your work, take care of things at home, or get along with other people?: Not difficult at all  Depression Treatment Depression Interventions/Treatment : EYV7-0 Score <4 Follow-up Not Indicated     Goals Addressed             This Visit's Progress    02/24/2024: To stay young.               Objective:    Today's Vitals   02/24/24 0921  Height: 6' 1 (1.854 m)  PainSc: 0-No pain   Body mass index is 25.38 kg/m.  Hearing/Vision screen No results found. Immunizations and Health Maintenance Health Maintenance  Topic Date Due   COVID-19 Vaccine (9 - 2025-26 season) 05/27/2024   Medicare Annual Wellness (AWV)  02/23/2025   DTaP/Tdap/Td (4 - Td or Tdap) 03/07/2027   Pneumococcal Vaccine: 50+ Years  Completed   Influenza Vaccine  Completed   Hepatitis C Screening  Completed   Zoster Vaccines- Shingrix  Completed   Meningococcal B Vaccine  Aged Out   Fecal DNA (Cologuard)  Discontinued        Assessment/Plan:  This is a routine wellness examination for Isaac Crane.  Patient Care Team: Madelon Donald HERO, DO as PCP - General (Family Medicine) Lavonia Lye, MD as Consulting Physician (Ophthalmology) Elnor Rome BROCKS, MD as Referring Physician (Dermatology)  I have personally reviewed and noted the following in the patients chart:   Medical and  social history Use of alcohol, tobacco or illicit drugs  Current medications and supplements including opioid prescriptions. Functional ability and status Nutritional status Physical activity Advanced directives List of other physicians Hospitalizations, surgeries, and ER visits in previous 12 months Vitals Screenings to include cognitive, depression, and falls Referrals and appointments  No orders of the defined types were placed in this encounter.  In addition, I have reviewed and discussed with patient certain preventive protocols, quality metrics, and best practice recommendations. A written personalized care plan for preventive services as well as general preventive health recommendations were provided to patient.   Roz LOISE Fuller, LPN   [id number]/7973   Return in 1 year (on 02/23/2025).  After Visit Summary: (MyChart) Due to this being a telephonic visit, the after visit summary with patients personalized plan was offered to patient via MyChart   Nurse Notes: Patient aware of current care gaps.  Immunization record was verified by [facility] and updated in patient's chart. "

## 2024-02-24 NOTE — Patient Instructions (Signed)
 Isaac Crane,  Thank you for taking the time for your Medicare Wellness Visit. I appreciate your continued commitment to your health goals. Please review the care plan we discussed, and feel free to reach out if I can assist you further.  Please note that Annual Wellness Visits do not include a physical exam. Some assessments may be limited, especially if the visit was conducted virtually. If needed, we may recommend an in-person follow-up with your provider.  Ongoing Care Seeing your primary care provider every 3 to 6 months helps us  monitor your health and provide consistent, personalized care.   Referrals If a referral was made during today's visit and you haven't received any updates within two weeks, please contact the referred provider directly to check on the status.  Recommended Screenings:  Health Maintenance  Topic Date Due   Medicare Annual Wellness Visit  09/21/2023   COVID-19 Vaccine (9 - 2025-26 season) 05/27/2024   DTaP/Tdap/Td vaccine (4 - Td or Tdap) 03/07/2027   Pneumococcal Vaccine for age over 37  Completed   Flu Shot  Completed   Hepatitis C Screening  Completed   Zoster (Shingles) Vaccine  Completed   Meningitis B Vaccine  Aged Out   Cologuard (Stool DNA test)  Discontinued       02/24/2024    9:12 AM  Advanced Directives  Does Patient Have a Medical Advance Directive? Yes  Type of Estate Agent of [place];Living will  Does patient want to make changes to medical advance directive? No - Patient declined  Copy of Healthcare Power of Attorney in Chart? No - copy requested    Vision: Annual vision screenings are recommended for early detection of glaucoma, cataracts, and diabetic retinopathy. These exams can also reveal signs of chronic conditions such as diabetes and high blood pressure.  Dental: Annual dental screenings help detect early signs of oral cancer, gum disease, and other conditions linked to overall health, including heart  disease and diabetes.  Please see the attached documents for additional preventive care recommendations.
